# Patient Record
Sex: Male | Born: 1995 | Race: White | Hispanic: No | Marital: Married | Smoking: Former smoker
Health system: Southern US, Community
[De-identification: ages and names within clinical notes are randomized; demographics above are authoritative.]

## PROBLEM LIST (undated history)

## (undated) DIAGNOSIS — G43909 Migraine, unspecified, not intractable, without status migrainosus: Secondary | ICD-10-CM

## (undated) DIAGNOSIS — F419 Anxiety disorder, unspecified: Secondary | ICD-10-CM

## (undated) DIAGNOSIS — F32A Depression, unspecified: Secondary | ICD-10-CM

## (undated) DIAGNOSIS — I639 Cerebral infarction, unspecified: Secondary | ICD-10-CM

## (undated) DIAGNOSIS — F909 Attention-deficit hyperactivity disorder, unspecified type: Secondary | ICD-10-CM

## (undated) DIAGNOSIS — J45909 Unspecified asthma, uncomplicated: Secondary | ICD-10-CM

## (undated) DIAGNOSIS — Q213 Tetralogy of Fallot: Secondary | ICD-10-CM

## (undated) DIAGNOSIS — F329 Major depressive disorder, single episode, unspecified: Secondary | ICD-10-CM

## (undated) HISTORY — PX: TETRALOGY OF FALLOT REPAIR: SHX796

## (undated) HISTORY — DX: Anxiety disorder, unspecified: F41.9

## (undated) HISTORY — DX: Depression, unspecified: F32.A

## (undated) HISTORY — DX: Cerebral infarction, unspecified: I63.9

## (undated) HISTORY — DX: Migraine, unspecified, not intractable, without status migrainosus: G43.909

## (undated) HISTORY — DX: Major depressive disorder, single episode, unspecified: F32.9

## (undated) HISTORY — DX: Attention-deficit hyperactivity disorder, unspecified type: F90.9

## (undated) HISTORY — DX: Unspecified asthma, uncomplicated: J45.909

## (undated) HISTORY — DX: Tetralogy of Fallot: Q21.3

---
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
## 2015-03-20 ENCOUNTER — Ambulatory Visit (INDEPENDENT_AMBULATORY_CARE_PROVIDER_SITE_OTHER): Payer: BLUE CROSS/BLUE SHIELD | Admitting: Family Medicine

## 2015-03-20 ENCOUNTER — Encounter: Payer: Self-pay | Admitting: Family Medicine

## 2015-03-20 ENCOUNTER — Telehealth: Payer: Self-pay | Admitting: Family Medicine

## 2015-03-20 VITALS — BP 122/74 | HR 121 | Temp 98.4°F | Ht 69.0 in | Wt 192.8 lb

## 2015-03-20 DIAGNOSIS — J0100 Acute maxillary sinusitis, unspecified: Secondary | ICD-10-CM

## 2015-03-20 MED ORDER — AZITHROMYCIN 250 MG PO TABS: ORAL_TABLET | ORAL | Status: DC

## 2015-03-20 NOTE — Progress Notes (Signed)
Pre visit review using our clinic review tool, if applicable. No additional management support is needed unless otherwise documented below in the visit note. 

## 2015-03-20 NOTE — Progress Notes (Signed)
   Subjective:    Patient ID: Carlos GalloJonathan Poteat-Smith, male    DOB: [birth year], 19 y.o.   MRN: [id number]  HPI 19 yr old male here for 3 days of sinus pressure, HA, PND, ST, and a dry cough. No fever. He has taken Tylenol and is drinking fluids. This has not helped. His brother has recently been treated for a similar illness.    Review of Systems  Constitutional: Negative.   HENT: Positive for congestion, postnasal drip and sinus pressure.   Eyes: Negative.   Respiratory: Positive for cough.   Cardiovascular: Negative.        Objective:   Physical Exam  Constitutional: He appears well-developed and well-nourished. No distress.  HENT:  Right Ear: External ear normal.  Left Ear: External ear normal.  Nose: Nose normal.  Mouth/Throat: Oropharynx is clear and moist.  Eyes: Conjunctivae are normal.  Neck: No thyromegaly present.  Cardiovascular: Normal rate, regular rhythm, normal heart sounds and intact distal pulses.   Pulmonary/Chest: Effort normal and breath sounds normal. No respiratory distress. He has no wheezes. He has no rales.  Lymphadenopathy:    He has no cervical adenopathy.          Assessment & Plan:  Early sinusitis. Treat with a Zpack and Mucinex. He will return next month to get established with Dr. Durene CalHunter.

## 2015-03-20 NOTE — Telephone Encounter (Signed)
[facility] - Client TELEPHONE ADVICE RECORD [facility] DO[facility]: 1996/08/24 Initial Comment Caller states his son is having cold symptoms with a headache. Nurse Assessment Nurse: Lane HackerHarley, RN, Elvin SoWindy Date/Time (Eastern Time): 03/20/2015 8:54:55 AM Confirm and document reason for call. If symptomatic, describe symptoms. ---Caller states his son is having cold symptoms with a headache, rates 6-7/10. One of his nostrils is blocked, and pressure in the nose/cheek. Sore throat, rates pain now at 6/10 and hurts to swallow. - Family has been sick with these s/s also. S/S confirmed with son. Has the patient traveled out of the country within the last 30 days? ---No Does the patient require triage? ---Yes Related visit to physician within the last 2 weeks? ---No s/s started yesterday Does the PT have any chronic conditions? (i.e. diabetes, asthma, etc.) ---Yes List chronic conditions. ---Migraines Guidelines Guideline Title Affirmed Question Affirmed Notes Sinus Pain or Congestion Lots of coughing lots of coughing during the night only that woke him up twice; and HA kept him from sleeping til around 3 am. Final Disposition User See PCP When Office is Open (within 3 days) Lane HackerHarley, Charity fundraiserN, [place] Comments Father just wanted an "UC" type appt since his son has urgent s/s. He has not been seen at this practice yet. RN tried to schedule H&P visit sooner. Father states that they do not have the paper work with them, and just want an UC appt or they will go to UC. RN made acute appt. Call them back if any issues with appt. Thanks, Elvin SoWindy RN Appt made with Dr. Clent RidgesFry at 4:15 pm today.

## 2015-04-30 ENCOUNTER — Ambulatory Visit (INDEPENDENT_AMBULATORY_CARE_PROVIDER_SITE_OTHER): Payer: BLUE CROSS/BLUE SHIELD | Admitting: Family Medicine

## 2015-04-30 ENCOUNTER — Encounter: Payer: Self-pay | Admitting: Family Medicine

## 2015-04-30 VITALS — BP 92/64 | HR 56 | Temp 98.2°F | Ht 70.0 in | Wt 198.0 lb

## 2015-04-30 DIAGNOSIS — F909 Attention-deficit hyperactivity disorder, unspecified type: Secondary | ICD-10-CM | POA: Insufficient documentation

## 2015-04-30 DIAGNOSIS — L709 Acne, unspecified: Secondary | ICD-10-CM | POA: Diagnosis not present

## 2015-04-30 DIAGNOSIS — F900 Attention-deficit hyperactivity disorder, predominantly inattentive type: Secondary | ICD-10-CM | POA: Diagnosis not present

## 2015-04-30 DIAGNOSIS — Q213 Tetralogy of Fallot: Secondary | ICD-10-CM | POA: Diagnosis not present

## 2015-04-30 DIAGNOSIS — F329 Major depressive disorder, single episode, unspecified: Secondary | ICD-10-CM | POA: Insufficient documentation

## 2015-04-30 DIAGNOSIS — Z0000 Encounter for general adult medical examination without abnormal findings: Secondary | ICD-10-CM

## 2015-04-30 DIAGNOSIS — G43909 Migraine, unspecified, not intractable, without status migrainosus: Secondary | ICD-10-CM | POA: Insufficient documentation

## 2015-04-30 DIAGNOSIS — Z8659 Personal history of other mental and behavioral disorders: Secondary | ICD-10-CM | POA: Insufficient documentation

## 2015-04-30 DIAGNOSIS — J45909 Unspecified asthma, uncomplicated: Secondary | ICD-10-CM | POA: Insufficient documentation

## 2015-04-30 DIAGNOSIS — Z7251 High risk heterosexual behavior: Secondary | ICD-10-CM

## 2015-04-30 DIAGNOSIS — F32A Depression, unspecified: Secondary | ICD-10-CM | POA: Insufficient documentation

## 2015-04-30 LAB — POCT URINALYSIS DIPSTICK
BILIRUBIN UA: NEGATIVE
Blood, UA: NEGATIVE
Glucose, UA: NEGATIVE
Ketones, UA: NEGATIVE
Leukocytes, UA: NEGATIVE
Nitrite, UA: NEGATIVE
PROTEIN UA: NEGATIVE
SPEC GRAV UA: 1.025
Urobilinogen, UA: 0.2
pH, UA: 6.5

## 2015-04-30 LAB — CBC WITH DIFFERENTIAL/PLATELET
BASOS ABS: 0 10*3/uL (ref 0.0–0.1)
Basophils Relative: 0.4 % (ref 0.0–3.0)
Eosinophils Absolute: 0.1 10*3/uL (ref 0.0–0.7)
Eosinophils Relative: 1.8 % (ref 0.0–5.0)
HCT: 47.6 % (ref 36.0–49.0)
HEMOGLOBIN: 16.1 g/dL — AB (ref 12.0–16.0)
LYMPHS ABS: 3 10*3/uL (ref 0.7–4.0)
Lymphocytes Relative: 43.7 % (ref 24.0–48.0)
MCHC: 33.9 g/dL (ref 31.0–37.0)
MCV: 84.1 fl (ref 78.0–98.0)
MONO ABS: 0.7 10*3/uL (ref 0.1–1.0)
Monocytes Relative: 10 % (ref 3.0–12.0)
NEUTROS ABS: 3 10*3/uL (ref 1.4–7.7)
Neutrophils Relative %: 44.1 % (ref 43.0–71.0)
Platelets: 253 10*3/uL (ref 150.0–575.0)
RBC: 5.66 Mil/uL (ref 3.80–5.70)
RDW: 13.3 % (ref 11.4–15.5)
WBC: 6.9 10*3/uL (ref 4.5–13.5)

## 2015-04-30 LAB — COMPREHENSIVE METABOLIC PANEL
ALK PHOS: 58 U/L (ref 52–171)
ALT: 17 U/L (ref 0–53)
AST: 23 U/L (ref 0–37)
Albumin: 5.1 g/dL (ref 3.5–5.2)
BUN: 19 mg/dL (ref 6–23)
CHLORIDE: 102 meq/L (ref 96–112)
CO2: 28 mEq/L (ref 19–32)
Calcium: 9.8 mg/dL (ref 8.4–10.5)
Creatinine, Ser: 1.07 mg/dL (ref 0.40–1.50)
GFR: 94.36 mL/min (ref >60.00)
GLUCOSE: 100 mg/dL — AB (ref 70–99)
POTASSIUM: 4 meq/L (ref 3.5–5.1)
Sodium: 139 mEq/L (ref 135–145)
Total Bilirubin: 0.7 mg/dL (ref 0.2–1.2)
Total Protein: 8.1 g/dL (ref 6.0–8.3)

## 2015-04-30 LAB — LIPID PANEL
Cholesterol: 183 mg/dL (ref 0–200)
HDL: 54.6 mg/dL (ref >39.00)
LDL CALC: 116 mg/dL — AB (ref 0–99)
NonHDL: 128.4
Total CHOL/HDL Ratio: 3
Triglycerides: 63 mg/dL (ref 0.0–149.0)
VLDL: 12.6 mg/dL (ref 0.0–40.0)

## 2015-04-30 LAB — TSH: TSH: 2.25 u[IU]/mL (ref 0.40–5.00)

## 2015-04-30 NOTE — Patient Instructions (Addendum)
Goal exercise 150 minutes a week outside of work. Great you are getting activity at work as well.   Let's target by end of summer weight of 180.   Sign release of information at the front desk for records from Dr. Cephus Shellinguller especially any immunizations (though we will also check NCIR- state registry)  Schedule a lab visit and return for a urine STD test. This is done differently than urine you did today. Cannot have peed for an hour, need first part of your stream and only a little bit of urine- lab will instruct you on the day of. Always use protection  Recommend not smoking cigarettes at all.

## 2015-04-30 NOTE — Progress Notes (Signed)
Tana ConchStephen Hunter, MD Phone: [phone]  Subjective:  Patient presents today to establish care. [facility]. Chief complaint-noted.   Goes by Johnny   Peak weight 215 in college, HS weight 175. Has lost about 20 lbs since his peak.   Current some day smoker. Less than 20 in 12 months. Advised cessation Alcohol-once a week, advised cessation/safest use if using Recreational drugs- rare marijuana- advised cessation/safest use if using  6-7 hours sleep- could increase  Tdap 2018- getting records  Sees psychiatrist. Dr. Ivory Broadancey for ADHD meds  Migraines 5 total before freshman year, then had 1 about every 2 weeks, lasting day or 2. Stress as trigger. Had a lot of weight gain in college. Diet didn't change but physical activity did. Marland Kitchen.   anxiety/history when on 70mg  vyvanse- improved with decreased dose.  HPV vaccinated reportedly but denies STD screening- active with GF only Return for urine sample for STD portion  ROS- pertinent no unintentional weight loss, anxiety, depression, SI/HI, breathing issues, intractable headaches, chest pain, shortness of breath, nausea, vomiting.   The following were reviewed and entered/updated in epic: Past Medical History  Diagnosis Date  . Migraines   . Asthma     exercise induced  . Depression     anxiety/history when on 70mg  vyvanse- improved with decreased dose.   . ADHD (attention deficit hyperactivity disorder)     vyvanse 60mg   . Tetralogy of Fallot     s/p repair. [facility] saw cardiology until about age 44-told return 7 years.    Patient Active Problem List   Diagnosis Date Noted  . ADHD (attention deficit hyperactivity disorder)     Priority: High  . Tetralogy of Fallot     Priority: High  . Asthma     Priority: Medium  . Migraines     Priority: Medium  . Depression     Priority: Medium   Past Surgical History  Procedure Laterality Date  . Ganglion cyst excision  2014  . Tetralogy of fallot  repair      Family History  Problem Relation Age of Onset  . Other Mother     adopted  . Alcohol abuse      biological parents per adopted father    Medications- reviewed and updated Current Outpatient Prescriptions  Medication Sig Dispense Refill  . lisdexamfetamine (VYVANSE) 60 MG capsule Take 60 mg by mouth every morning.     No current facility-administered medications for this visit.    Allergies-reviewed and updated No Known Allergies  History   Social History  . Marital Status: Single    Spouse Name: N/A  . Number of Children: N/A  . Years of Education: N/A   Social History Main Topics  . Smoking status: Current Some Day Smoker  . Smokeless tobacco: Not on file  . Alcohol Use: 0.6 oz/week    1 Standard drinks or equivalent per week  . Drug Use: Yes    Special: Marijuana  . Sexual Activity:    Partners: Female   Other Topics Concern  . None   Social History Narrative   Family: Single. Adopted Son of [place] AnisKelly Smith of replacements limited (also patient of Dr. Durene CalHunter). 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 Oropharynx normal. TM normal. Eyes: sclera and lids normal, PERRLA Neck: no thyromegaly, no cervical lymphadenopathy CV: RRR, 2/6 diastolic murmur LUSB, no rubs or gallops Lungs: CTAB no crackles, wheeze, rhonchi Abdomen: soft/nontender/nondistended/normal  bowel sounds. No rebound or guarding.  GU: uncircumcised, no lesions or warts Ext: no edema Skin: warm, dry, no rash Neuro: 5/5 strength in upper and lower extremities, normal gait, normal reflexes   Assessment/Plan:  19 y.o. male presenting for annual physical.  Health Maintenance counseling: 1. Anticipatory guidance: Patient counseled regarding regular dental exams, wearing seatbelts, sunscreen.  2. Risk factor reduction:  Advised patient of need for regular exercise and diet rich and fruits and vegetables to reduce risk of heart attack and stroke.  3. Immunizations/screenings/ancillary studies Health Maintenance Due  Topic Date Due  . HIV Screening - add on if able 01/22/2011  . TETANUS/TDAP - getting records 01/22/2015  4. Advise testicular self exams- already doing, no concerns 5. Opts in STD testint  NCIR checked- states no immunizations found, will await records  ADHD controlled under psych care Await tetralogy of fallot repair records No recent issues with asthma No depression Migraines reasonably controlled.   1 year CPE. See AVS goals.   Orders Placed This Encounter  Procedures  . CBC with Differential  . CMP  . TSH  . Lipid Panel  . RPR    [facility]  . HIV antibody    [facility]  . POC Urinalysis Dipstick

## 2015-05-01 DIAGNOSIS — L709 Acne, unspecified: Secondary | ICD-10-CM | POA: Insufficient documentation

## 2015-05-01 LAB — HIV ANTIBODY (ROUTINE TESTING W REFLEX): HIV: NONREACTIVE

## 2015-05-01 LAB — RPR

## 2015-05-01 NOTE — Progress Notes (Signed)
Copied from care everywhere   Carlos Jacobs, Carlos L, MD - 03/13/2013 1:23 PM EDT Assessment/Plan:   My impression is that Carlos Jacobs is a 19 y.o. 1 m.o. male with a history of tetralogy of Fallot repair who is stable from a cardiac standpoint. Carlos Jacobs's echocardiogram today looks completely fine. He did get some mild pulmonary insufficiency and normal Doppler flows across all other valves. There is normal biventricular size and function seen. I cannot give a cardiac etiology as to his persistent cough at this time. Perhaps he is having some allergies, or maybe this is bronchospasm. He does seem to resume exercise perhaps formal exercise testing may be helpful. His EKG is unchanged from the past and his chest x-ray looks very reassuring.  SBE prophylaxis is not indicated..   Activity: Carlos Jacobs should have no restrictions from a cardiac standpoint. He should be fully allowed to participate in all sports.   Follow up: I have not made a specific follow up to see Carlos Jacobs back in my pediatric cardiology office at this time, but I would be happy to do so in future should the need arise.   I discussed all findings with the patient and father and answered all questions.   Thank you for allowing me to participate in Carlos Jacobs's care. Please do not hesitate to contact me if there are any further questions.  History:  I had the pleasure of seeing Carlos Jacobs in [place] today at the request of Dr. Larene Beachhristopher Culler for evaluation of a persistent cough. Carlos Jacobs is a 19 y.o. 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 His cough seems to been exacerbated for the past 2-3 months after he had a sinus infection. He says the cough occurs when he is laying down to sleep at night or when he's exercising. He denies any chest pain, syncope, cyanosis, or easy  fatigability. He was recently started on an antacid, because he did have some issues related to gagging or "food coming up ". He has noted no change in the cough with his antacid.   Medications: Current outpatient prescriptions:lisdexamfetamine (VYVANSE) 60 MG capsule, Take 60 mg by mouth. Frequency:QD Dosage:60 MG Instructions: Note:Dose: 60MG , Disp: , Rfl:   No Known Allergies  Immunizations: Up to date  Diet: Regular for age   Physcial Exam:  Filed Vitals:  03/13/13 1142  BP: 116/65  Pulse: 83  Resp: 20  Height: 174.5 cm (5' 8.7")  Weight: 66.6 kg (146 lb 13.2 oz)   Body mass index is 21.87 kg/(m^2). 56%ile (Z=0.15) based on CDC 2-20 Years weight-for-age data. 45%ile (Z=-0.13) based on CDC 2-20 Years stature-for-age data.  General: Well appearing in no acute distress Head: Normocephalic, atraumatic  Eyes: Normal set, anicteric Ears: Normal set Oropharynx: Pink, moist mucosa Neck: Supple, no thyromegaly Lymph: No cervical lymphadenopathy Pulmonary: Normal respiratory effort, clear to ausculation bilaterally without crackles or wheezes Cardiovascular: Normal precordial impulse, regular rate and rhythm. There was a normal S1 and physiologic split S2. No systolic or diastolic murmur noted. No carotid bruits. Pulses 2+ upper and lower extremities and symmetric throughout. Abdomen: Soft, non tender, non distended, no hepatosplenomegaly, positive bowel sounds Extremities: Warm, moves all extremities spontaneously, no obvious deformities Skin: No cyanosis, clubbing, or edema Neuro: Grossly intact  Lab Data:   A 12 lead ECG was performed and revealed normal sinus rhythm with a normal  PR, QRS, and corrected QT interval. There is a right axis seen. A pediatric echocardiogram was performed and revealed normal biventricular size and function, there was mild pulmonary insufficiency seen and normal Doppler flows across all other valves within the heart.

## 2015-05-02 ENCOUNTER — Other Ambulatory Visit (HOSPITAL_COMMUNITY)
Admission: RE | Admit: 2015-05-02 | Discharge: 2015-05-02 | Disposition: A | Discharge status: Home / Self Care | Payer: BLUE CROSS/BLUE SHIELD | Source: Ambulatory Visit | Attending: Family Medicine | Admitting: Family Medicine

## 2015-05-02 ENCOUNTER — Other Ambulatory Visit: Payer: BLUE CROSS/BLUE SHIELD

## 2015-05-02 DIAGNOSIS — Z113 Encounter for screening for infections with a predominantly sexual mode of transmission: Secondary | ICD-10-CM | POA: Insufficient documentation

## 2015-05-02 DIAGNOSIS — Z7251 High risk heterosexual behavior: Secondary | ICD-10-CM

## 2015-05-02 DIAGNOSIS — Z0000 Encounter for general adult medical examination without abnormal findings: Secondary | ICD-10-CM

## 2015-05-03 ENCOUNTER — Other Ambulatory Visit: Payer: BLUE CROSS/BLUE SHIELD

## 2015-05-03 LAB — URINE CYTOLOGY ANCILLARY ONLY
Chlamydia: NEGATIVE
Neisseria Gonorrhea: NEGATIVE
TRICH (WINDOWPATH): NEGATIVE

## 2015-05-09 ENCOUNTER — Ambulatory Visit (INDEPENDENT_AMBULATORY_CARE_PROVIDER_SITE_OTHER): Payer: BLUE CROSS/BLUE SHIELD | Admitting: Adult Health

## 2015-05-09 ENCOUNTER — Encounter: Payer: Self-pay | Admitting: Adult Health

## 2015-05-09 VITALS — BP 110/80 | Temp 98.2°F | Ht 70.0 in | Wt 200.2 lb

## 2015-05-09 DIAGNOSIS — R519 Headache, unspecified: Secondary | ICD-10-CM

## 2015-05-09 DIAGNOSIS — R51 Headache: Secondary | ICD-10-CM

## 2015-05-09 NOTE — Progress Notes (Signed)
Subjective:    Patient ID: Carlos Jacobs, male    DOB: 06-08-1996, 19 y.o.   MRN: [id number]  HPI  19 year old healthy male who presents to the office today to be evaluated for sinus infection. He woke up this morning with temporal headache and temporal sinus pressure. The headache has diminished in quality but continues to have sinus pressure. Denies fever, chills and fatigue  Denies taking any OTC medications. Father has a sinus infection.   Review of Systems  Constitutional: Negative.   HENT: Positive for congestion and sinus pressure. Negative for ear discharge, ear pain, nosebleeds, postnasal drip, rhinorrhea and sore throat.   Eyes: Negative.   Respiratory: Negative for shortness of breath.   Neurological: Negative.   All other systems reviewed and are negative.  Past Medical History  Diagnosis Date  . Migraines   . Asthma     exercise induced  . Depression     anxiety/history when on 70mg  vyvanse- improved with decreased dose.   . ADHD (attention deficit hyperactivity disorder)     vyvanse 60mg   . Tetralogy of Fallot     s/p repair. [facility] saw cardiology until about age 19-told return 7 years.     History   Social History  . Marital Status: Single    Spouse Name: N/A  . Number of Children: N/A  . Years of Education: N/A   Occupational History  . Not on file.   Social History Main Topics  . Smoking status: Current Some Day Smoker  . Smokeless tobacco: Not on file  . Alcohol Use: 0.6 oz/week    1 Standard drinks or equivalent per week  . Drug Use: Yes    Special: Marijuana  . Sexual Activity:    Partners: Female   Other Topics Concern  . Not on file   Social History Narrative   Family: Single. Adopted Son of [place] Anis of replacements limited (also patient of Dr. Durene Cal). 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 Ganglion cyst excision  2014  . Tetralogy of fallot repair      Family History  Problem Relation Age of Onset  . Other Mother     adopted  . Alcohol abuse      biological parents per adopted father    No Known Allergies  Current Outpatient Prescriptions on File Prior to Visit  Medication Sig Dispense Refill  . lisdexamfetamine (VYVANSE) 60 MG capsule Take 60 mg by mouth every morning.     No current facility-administered medications on file prior to visit.    BP 110/80 mmHg  Temp(Src) 98.2 F (36.8 C) (Oral)  Ht 5\' 10"  (1.778 m)  Wt 200 lb 3.2 oz (90.81 kg)  BMI 28.73 kg/m2        Objective:   Physical Exam  Constitutional: He is oriented to person, place, and time. He appears well-developed and well-nourished. No distress.  HENT:  Head: Normocephalic and atraumatic.  Right Ear: External ear normal.  Left Ear: External ear normal.  Nose:  Nose normal.  Mouth/Throat: No oropharyngeal exudate.  TM's visualized  Eyes: Conjunctivae and EOM are normal. Pupils are equal, round, and reactive to light. Right eye exhibits no discharge. Left eye exhibits no discharge.  Neck: Normal range of motion. Neck supple.  Cardiovascular: Normal rate, regular rhythm, normal heart sounds and intact distal pulses.  Exam reveals no gallop and no friction rub.   No murmur heard. Pulmonary/Chest: Effort normal and breath sounds normal. No respiratory distress. He has no wheezes. He has no rales. He exhibits no tenderness.  Lymphadenopathy:    He has cervical adenopathy.  Neurological: He is alert and oriented to person, place, and time.  Skin: Skin is warm and dry. No rash noted. He is not diaphoretic. No erythema. No pallor.    Psychiatric: He has a normal mood and affect. His behavior is normal. Judgment and thought content normal.  Nursing note and vitals reviewed.      Assessment & Plan:  1. Nonintractable episodic headache, unspecified headache type - may be sinus infection or allergies, it is too soon to tell.  - Flonase and zyrtec for sinus pressure - Tylenol or motrin for headache.  - Follow up if symptoms do not improve in the next 2-3 days.

## 2015-05-09 NOTE — Patient Instructions (Signed)
As we discussed, it is hard to tell whether this is a sinus infection or allergies.   Use Ibuprofen or tylenol for the headache  Flonase and zyrtec will help with the sinus pressure.   If you still have the symptoms 7-10 days from now, please let me know.

## 2015-06-04 ENCOUNTER — Telehealth: Payer: Self-pay | Admitting: Family Medicine

## 2015-06-04 DIAGNOSIS — L709 Acne, unspecified: Secondary | ICD-10-CM

## 2015-06-04 NOTE — Telephone Encounter (Signed)
Pt states his pediatrician prescribed  Differin for acne. Now pt is out and wants to know i dr hunter will prescribe for him.  [facility]  Pt aware dr hunter out today. cotact number for this call is his dad's b/c pt is working.

## 2015-06-06 MED ORDER — ADAPALENE 0.1 % EX GEL: Freq: Every day | CUTANEOUS | Status: DC

## 2015-06-06 NOTE — Telephone Encounter (Signed)
[facility]. Please make patient aware.   It also appears FDA recently (July 2016) made this formulation available over the counter. May be cheaper with rx but may want to price check as well if they have OTC formulation.

## 2015-06-06 NOTE — Telephone Encounter (Signed)
Pt.notified

## 2015-06-14 ENCOUNTER — Other Ambulatory Visit: Payer: Self-pay | Admitting: Family Medicine

## 2015-06-14 DIAGNOSIS — H9190 Unspecified hearing loss, unspecified ear: Secondary | ICD-10-CM

## 2016-05-27 ENCOUNTER — Other Ambulatory Visit (INDEPENDENT_AMBULATORY_CARE_PROVIDER_SITE_OTHER): Payer: No Typology Code available for payment source

## 2016-05-27 DIAGNOSIS — Z0000 Encounter for general adult medical examination without abnormal findings: Secondary | ICD-10-CM

## 2016-05-27 LAB — HEPATIC FUNCTION PANEL
ALBUMIN: 4.9 g/dL (ref 3.5–5.2)
ALK PHOS: 50 U/L (ref 39–117)
ALT: 20 U/L (ref 0–53)
AST: 26 U/L (ref 0–37)
BILIRUBIN DIRECT: 0.2 mg/dL (ref 0.0–0.3)
TOTAL PROTEIN: 7.9 g/dL (ref 6.0–8.3)
Total Bilirubin: 1 mg/dL (ref 0.2–1.2)

## 2016-05-27 LAB — CBC WITH DIFFERENTIAL/PLATELET
BASOS ABS: 0 10*3/uL (ref 0.0–0.1)
BASOS PCT: 0.4 % (ref 0.0–3.0)
EOS ABS: 0.1 10*3/uL (ref 0.0–0.7)
EOS PCT: 0.8 % (ref 0.0–5.0)
HEMATOCRIT: 45.6 % (ref 39.0–52.0)
Hemoglobin: 15.5 g/dL (ref 13.0–17.0)
LYMPHS PCT: 30.4 % (ref 12.0–46.0)
Lymphs Abs: 2.4 10*3/uL (ref 0.7–4.0)
MCHC: 34.1 g/dL (ref 30.0–36.0)
MCV: 83.7 fl (ref 78.0–100.0)
Monocytes Absolute: 0.9 10*3/uL (ref 0.1–1.0)
Monocytes Relative: 11.4 % (ref 3.0–12.0)
NEUTROS ABS: 4.5 10*3/uL (ref 1.4–7.7)
NEUTROS PCT: 57 % (ref 43.0–77.0)
PLATELETS: 246 10*3/uL (ref 150.0–400.0)
RBC: 5.44 Mil/uL (ref 4.22–5.81)
RDW: 13.2 % (ref 11.5–14.6)
WBC: 8 10*3/uL (ref 4.5–10.5)

## 2016-05-27 LAB — POC URINALSYSI DIPSTICK (AUTOMATED)
GLUCOSE UA: NEGATIVE
Ketones, UA: NEGATIVE
Leukocytes, UA: NEGATIVE
NITRITE UA: NEGATIVE
PH UA: 5.5
RBC UA: NEGATIVE
Urobilinogen, UA: 0.2

## 2016-05-27 LAB — LIPID PANEL
CHOL/HDL RATIO: 3
Cholesterol: 161 mg/dL (ref 0–200)
HDL: 53.7 mg/dL (ref >39.00)
LDL Cholesterol: 97 mg/dL (ref 0–99)
NONHDL: 107.12
Triglycerides: 50 mg/dL (ref 0.0–149.0)
VLDL: 10 mg/dL (ref 0.0–40.0)

## 2016-05-27 LAB — TSH: TSH: 3.29 u[IU]/mL (ref 0.35–5.50)

## 2016-05-27 LAB — BASIC METABOLIC PANEL
BUN: 16 mg/dL (ref 6–23)
CHLORIDE: 103 meq/L (ref 96–112)
CO2: 27 meq/L (ref 19–32)
Calcium: 9.7 mg/dL (ref 8.4–10.5)
Creatinine, Ser: 1.12 mg/dL (ref 0.40–1.50)
GFR: 88.53 mL/min (ref >60.00)
Glucose, Bld: 90 mg/dL (ref 70–99)
POTASSIUM: 3.7 meq/L (ref 3.5–5.1)
Sodium: 139 mEq/L (ref 135–145)

## 2016-06-02 ENCOUNTER — Encounter: Payer: BLUE CROSS/BLUE SHIELD | Admitting: Family Medicine

## 2016-09-18 ENCOUNTER — Encounter: Payer: Self-pay | Admitting: Family Medicine

## 2016-09-18 ENCOUNTER — Ambulatory Visit (INDEPENDENT_AMBULATORY_CARE_PROVIDER_SITE_OTHER): Payer: No Typology Code available for payment source | Admitting: Family Medicine

## 2016-09-18 VITALS — BP 92/66 | HR 72 | Temp 97.5°F | Ht 70.0 in | Wt 210.0 lb

## 2016-09-18 DIAGNOSIS — Z0000 Encounter for general adult medical examination without abnormal findings: Secondary | ICD-10-CM | POA: Diagnosis not present

## 2016-09-18 NOTE — Progress Notes (Signed)
Phone: [phone]  Subjective:  Patient presents today for their annual physical. Chief complaint-noted.   See problem oriented charting- ROS- full  review of systems was completed and negative except for: weight gain- but has not been exercising  The following were reviewed and entered/updated in epic: Past Medical History:  Diagnosis Date  . ADHD (attention deficit hyperactivity disorder)    vyvanse '60mg'$   . Asthma    exercise induced  . Depression    anxiety/history when on '70mg'$  vyvanse- improved with decreased dose.   . Migraines   . Tetralogy of Fallot    s/p repair. [facility] saw cardiology until about age 39-told return 7 years.    Patient Active Problem List   Diagnosis Date Noted  . ADHD (attention deficit hyperactivity disorder)     Priority: High  . Tetralogy of Fallot     Priority: High  . Asthma     Priority: Medium  . Migraines     Priority: Medium  . Depression     Priority: Medium  . Acne 05/01/2015   Past Surgical History:  Procedure Laterality Date  . GANGLION CYST EXCISION  2014  . TETRALOGY OF FALLOT REPAIR      Family History  Problem Relation Age of Onset  . Other Mother     adopted  . Alcohol abuse      biological parents per adopted father    Medications- reviewed and updated Current Outpatient Prescriptions  Medication Sig Dispense Refill  . adapalene (DIFFERIN) 0.1 % gel Apply topically at bedtime. 45 g 5  . lisdexamfetamine (VYVANSE) 60 MG capsule Take 60 mg by mouth every morning.     No current facility-administered medications for this visit.     Allergies-reviewed and updated No Known Allergies  Social History   Social History  . Marital status: Single    Spouse name: N/A  . Number of children: N/A  . Years of education: N/A   Social History Main Topics  . Smoking status: Former Smoker    Types: Cigarettes  . Smokeless tobacco: None  . Alcohol use 0.6 oz/week    1 Standard drinks or equivalent per week  .  Drug use:     Types: Marijuana  . Sexual activity: Yes    Partners: Female   Other Topics Concern  . None   Social History Narrative   Family: Single- new GF 09/2016. Adopted Son of [place] of replacements limited (also patient of Dr. Yong Channel). 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 Oropharynx normal Neck: no thyromegaly CV: RRR no murmurs rubs or gallops Lungs: CTAB no crackles, wheeze, rhonchi Abdomen: soft/nontender/nondistended/normal bowel sounds. No rebound or guarding. Overweight- weight up  some from last year Ext: no edema Skin: warm, dry Neuro: grossly normal, moves all extremities, PERRLA   Assessment/Plan:  20 y.o. male presenting for annual physical.  Health Maintenance counseling: 1. Anticipatory guidance: Patient counseled regarding regular dental exams, eye exams (thinking about going for a check in- mild reading issues at times), wearing seatbelts.  2. Risk factor reduction:  Advised patient of need for regular exercise and diet rich and fruits and vegetables to reduce risk of heart attack and stroke. Up almost 20 lbs over last 18 months- discussed weight loss and starting exercise 3. Immunizations/screenings/ancillary studies Immunization History  Administered Date(s) Administered  . DTaP  03/24/1996, 06/01/1996, 07/20/1996, 06/25/1997, 09/07/2000  . HPV Quadrivalent 05/16/2012, 07/20/2012, 11/29/2012  . Hepatitis A 05/04/2011, 05/16/2012  . Hepatitis B 1996-02-19, 02/22/1996, 12/11/1996  . HiB (PRP-OMP) 03/24/1996, 06/01/1996, 07/20/1996, 05/15/1997  . IPV 03/24/1996, 06/01/1996, 06/25/1997, 09/07/2000  . Influenza Nasal 07/31/2009, 08/12/2011, 07/20/2012  . Influenza-Unspecified 08/27/2007, 08/08/2008, 09/21/2008, 09/04/2016  . MMR 06/15/1997, 11/03/2002  . Meningococcal Conjugate 05/04/2011  . Meningococcal Polysaccharide 05/25/2013  . Pneumococcal Polysaccharide-23 10/13/2014  . Tdap 01/24/2007  . Varicella 01/24/1997, 05/25/2013  4. Prostate cancer screening- unknown family history, start age 39  5. Colon cancer screening - unknown family history, start age 21 6. Skin cancer prevention- advised regular sunscreen use 7. Testicular cancer screening- advised regular self exams 8. STD testing- declines- did before getting into monogamous relationship  Status of chronic or acute concerns  History of tetralogy of fallot- repair 19 months. Told by [facility] ped cards no regular follow up  ADHD through Dr. Osborne Oman of psychiatry. dealth with anxiety on higher dose vyvanse- improve dback on the 73m dose.   Migraines- still about once a month no meds previously- now improving and very infrequent  Asthma- exercise induced but asthma symptoms only.   Acne- has used differin in the past  Has quit smoking- congratulated and encouraged to remain off  New GF- sexually active- GF with IUD. Both tested before active. Declines today   Return in about 1 year (around 09/18/2017) for physical. We discussed 2 years is reasonable as well as long as working on weight loss  Return precautions advised.   SGarret Reddish MD

## 2016-09-18 NOTE — Patient Instructions (Signed)
No changes today except work on trimming those extra 20 lbs back off  Somehow labs look better despite weight gain

## 2016-09-18 NOTE — Progress Notes (Signed)
Pre visit review using our clinic review tool, if applicable. No additional management support is needed unless otherwise documented below in the visit note. 

## 2016-10-14 ENCOUNTER — Telehealth: Payer: Self-pay | Admitting: Family Medicine

## 2016-10-14 NOTE — Telephone Encounter (Signed)
Pt called in and spoke with LaWana who asked me to take over call as she stated the pt was upset do to his 11 minute wait time to get through to us.  The call was parked and when I picked up the call the pt was very upset and aggressively addressed his frustrations.  He stated that he was angry because our office doesn't have any appts available this evening, our local offices have no available appts and that we can only offer [facility] which [facility] [place] minutes from his home.  He went on further to state that he called [facility] urgent care and that they couldn't see him either.  I apologized repeatedly for his negative experience in trying to get through and advised that we are aware that the phones have been acting up today and were working hard to correct the issue. He went on to say that the phones have been an issue for years and that we must not care that he and his insurance pay us so much money.  I apologized again and asked him if I could make him an appt for the following morning and he refused.

## 2017-01-21 ENCOUNTER — Emergency Department (HOSPITAL_COMMUNITY)
Admission: EM | Admit: 2017-01-21 | Discharge: 2017-01-21 | Disposition: A | Discharge status: Home / Self Care | Payer: No Typology Code available for payment source | Attending: Emergency Medicine | Admitting: Emergency Medicine

## 2017-01-21 ENCOUNTER — Emergency Department (HOSPITAL_COMMUNITY): Payer: No Typology Code available for payment source

## 2017-01-21 ENCOUNTER — Encounter (HOSPITAL_COMMUNITY): Payer: Self-pay | Admitting: Nurse Practitioner

## 2017-01-21 DIAGNOSIS — F17210 Nicotine dependence, cigarettes, uncomplicated: Secondary | ICD-10-CM | POA: Diagnosis not present

## 2017-01-21 DIAGNOSIS — F909 Attention-deficit hyperactivity disorder, unspecified type: Secondary | ICD-10-CM | POA: Insufficient documentation

## 2017-01-21 DIAGNOSIS — J45909 Unspecified asthma, uncomplicated: Secondary | ICD-10-CM | POA: Diagnosis not present

## 2017-01-21 DIAGNOSIS — R0789 Other chest pain: Secondary | ICD-10-CM | POA: Diagnosis not present

## 2017-01-21 DIAGNOSIS — R05 Cough: Secondary | ICD-10-CM | POA: Diagnosis present

## 2017-01-21 MED ORDER — NAPROXEN 500 MG PO TABS: 500.0000 mg | ORAL_TABLET | Freq: Two times a day (BID) | ORAL | 0 refills | Status: DC

## 2017-01-21 MED ORDER — KETOROLAC TROMETHAMINE 60 MG/2ML IM SOLN
60.0000 mg | Freq: Once | INTRAMUSCULAR | Status: AC
  Administered 2017-01-21: 60 mg via INTRAMUSCULAR
  Filled 2017-01-21: qty 2

## 2017-01-21 NOTE — ED Provider Notes (Signed)
[facility]-EMERGENCY DEPT Provider Note   CSN: [id number] Arrival date & time: 01/21/17  4098     History   Chief Complaint Chief Complaint  Patient presents with  . Cough    HPI Carlos Jacobs is a 21 y.o. male.  The history is provided by the patient and medical records.  Cough  Associated symptoms include chest pain.   21 y.o. M with hx of ADHD, asthma, depression, migraine headaches, hx of TOF repaired at birth without residual issues, presenting to the ED for chest pain.  Patient reports he had a cough for about 2 days earlier in the week, was dry and non-productive.  States he did not feel like it was even a hard cough.  Reports yesterday around 630 PM he started having central chest pain localized to sternum, mostly lower portion.  States no radiation of pain.  No associated dizziness, weakness, SOB, diaphoresis, nausea, vomiting, lightheadedness.  States pain is worse with palpation and movement.  States he has not had any cardiac issues since his TOF repair, was actually released from cardiology several years ago.  He is a smoker.  Denies recent drug use.  Has not tried any medications for his symptoms thus far.  Past Medical History:  Diagnosis Date  . ADHD (attention deficit hyperactivity disorder)    vyvanse   . Asthma    exercise induced  . Depression    anxiety/history when on  vyvanse- improved with decreased dose.   . Migraines   . Tetralogy of Fallot    s/p repair. [facility] saw cardiology until about age 21-told return 7 years.     Patient Active Problem List   Diagnosis Date Noted  . Acne 05/01/2015  . Asthma   . Migraines   . Depression   . ADHD (attention deficit hyperactivity disorder)   . Tetralogy of Fallot     Past Surgical History:  Procedure Laterality Date  . GANGLION CYST EXCISION  2014  . TETRALOGY OF FALLOT REPAIR         Home Medications    Prior to Admission medications   Medication Sig Start Date End Date Taking?  Authorizing Provider  adapalene (DIFFERIN) 0.1 % gel Apply topically at bedtime. 06/06/15   Shelva Majestic, MD  lisdexamfetamine (VYVANSE) 60 MG capsule Take 60 mg by mouth every morning.    Historical Provider, MD    Family History Family History  Problem Relation Age of Onset  . Other Mother     adopted  . Alcohol abuse      biological parents per adopted father    Social History Social History  Substance Use Topics  . Smoking status: Current Every Day Smoker    Packs/day: 0.50    Types: Cigarettes  . Smokeless tobacco: Never Used  . Alcohol use 0.6 oz/week    1 Standard drinks or equivalent per week     Comment: on occassion. Last beer at midnight tonight      Allergies   Patient has no known allergies.   Review of Systems Review of Systems  Respiratory: Positive for cough.   Cardiovascular: Positive for chest pain.  All other systems reviewed and are negative.    Physical Exam Updated Vital Signs BP 116/72   Pulse 70   Temp 98.1 F (36.7 C) (Oral)   Resp (!) 21   Ht  (1.803 m)   Wt 93 kg   SpO2 99%   BMI 28.59 kg/m   Physical Exam  Constitutional: He is oriented to person, place, and time. He appears well-developed and well-nourished.  HENT:  Head: Normocephalic and atraumatic.  Mouth/Throat: Oropharynx is clear and moist.  Eyes: Conjunctivae and EOM are normal. Pupils are equal, round, and reactive to light.  Neck: Normal range of motion.  Cardiovascular: Normal rate, regular rhythm and normal heart sounds.   Pulmonary/Chest: Effort normal and breath sounds normal. He has no wheezes. He has no rhonchi.  Tenderness of the mid chest wall along the sternum, there is no appreciable deformity, lungs are clear without wheezes or rhonchi, no distress, speaking in full sentences without difficulty  Abdominal: Soft. Bowel sounds are normal.  Musculoskeletal: Normal range of motion.  Neurological: He is alert and oriented to person, place, and time.    Skin: Skin is warm and dry.  Psychiatric: He has a normal mood and affect.  Nursing note and vitals reviewed.    ED Treatments / Results  Labs (all labs ordered are listed, but only abnormal results are displayed) Labs Reviewed - No data to display  EKG  EKG Interpretation  Date/Time:  Thursday January 21 2017 03:27:20 EDT Ventricular Rate:  84 PR Interval:    QRS Duration: 113 QT Interval:  398 QTC Calculation: 471 R Axis:   104 Text Interpretation:  Sinus rhythm Borderline intraventricular conduction delay ST elev, probable normal early repol pattern Borderline prolonged QT interval Baseline wander in lead(s) II aVF V3 V4 V5 No previous ECGs available Confirmed by Read Drivers  MD, Jonny Ruiz ([id number]) on 01/21/2017 3:48:22 AM       Radiology Dg Chest 2 View  Result Date: 01/21/2017 CLINICAL DATA:  Acute onset of midsternal chest pain. Initial encounter. EXAM: CHEST  2 VIEW COMPARISON:  None. FINDINGS: The lungs are well-aerated and clear. There is no evidence of focal opacification, pleural effusion or pneumothorax. The heart is normal in size; the mediastinal contour is within normal limits. The patient is status post median sternotomy. No acute osseous abnormalities are seen. IMPRESSION: No acute cardiopulmonary process seen. Electronically Signed   By: Roanna Raider M.D.   On: 01/21/2017 06:38    Procedures Procedures (including critical care time)  Medications Ordered in ED Medications  ketorolac (TORADOL) injection 60 mg (not administered)     Initial Impression / Assessment and Plan / ED Course  I have reviewed the triage vital signs and the nursing notes.  Pertinent labs & imaging results that were available during my care of the patient were reviewed by me and considered in my medical decision making (see chart for details).  21 year old male here with chest pain. Began last night around 6:30. Pain is worsened with palpation and movement. He is afebrile and nontoxic. Vital  signs are stable. EKG nonischemic. Pain is reproducible with palpation of the sternum, no noted deformities. Lungs are clear without wheezes or rhonchi. Chest x-ray was obtained which is negative.  Pain resolved here with Toradol, patient has been resting comfortably. At this time, feel this is likely chest wall pain. I do not suspect acute ACS, PE, dissection, or other acute cardiac event. Feel patient is stable for discharge. We'll continue anti-inflammatories. Will have him follow-up with his primary care doctor.  Discussed plan with patient, he acknowledged understanding and agreed with plan of care.  Return precautions given for new or worsening symptoms.  Final Clinical Impressions(s) / ED Diagnoses   Final diagnoses:  Chest wall pain    New Prescriptions New Prescriptions   NAPROXEN (NAPROSYN) 500 MG TABLET  Take 1 tablet (500 mg total) by mouth 2 (two) times daily with a meal.     Garlon Hatchet, PA-C 01/21/17 1610    Paula Libra, MD 01/21/17 2238

## 2017-01-21 NOTE — ED Notes (Signed)
Pt in XRAY 

## 2017-01-21 NOTE — ED Triage Notes (Signed)
Patient states he has been having upper respiratory symptoms over the past 3-4 days with cough, congestion. Denies fever. Has been taking sudafed, mucinex, and OTC cough medications. Last night started having some mid chest pain with coughing and movement. Denies pain radiating. Pain is reproducible with touch. Denies any shortness of breath, dizziness, light headedness, or pain in other areas.

## 2017-01-21 NOTE — ED Notes (Signed)
Pt verbalized pain 10 out 10 on movement but pain 2 out of 10 at rest. Pain meds given pain now a 7 out of 10 on movement, and 2 out 10 not moving still not moving.

## 2017-01-21 NOTE — Discharge Instructions (Signed)
Take the prescribed medication as directed. °Follow-up with your primary care doctor for any ongoing issues. °Return to the ED for new or worsening symptoms. °

## 2017-04-30 ENCOUNTER — Ambulatory Visit (INDEPENDENT_AMBULATORY_CARE_PROVIDER_SITE_OTHER): Payer: No Typology Code available for payment source | Admitting: Family Medicine

## 2017-04-30 ENCOUNTER — Encounter: Payer: Self-pay | Admitting: Family Medicine

## 2017-04-30 VITALS — BP 120/72 | HR 79 | Temp 98.7°F | Ht 70.25 in | Wt 220.2 lb

## 2017-04-30 DIAGNOSIS — Z23 Encounter for immunization: Secondary | ICD-10-CM | POA: Diagnosis not present

## 2017-04-30 DIAGNOSIS — E785 Hyperlipidemia, unspecified: Secondary | ICD-10-CM | POA: Diagnosis not present

## 2017-04-30 DIAGNOSIS — Z0000 Encounter for general adult medical examination without abnormal findings: Secondary | ICD-10-CM

## 2017-04-30 LAB — CBC
HCT: 45.9 % (ref 39.0–52.0)
HEMOGLOBIN: 15.2 g/dL (ref 13.0–17.0)
MCHC: 33 g/dL (ref 30.0–36.0)
MCV: 85.5 fl (ref 78.0–100.0)
Platelets: 241 10*3/uL (ref 150.0–400.0)
RBC: 5.37 Mil/uL (ref 4.22–5.81)
RDW: 13.2 % (ref 11.5–15.5)
WBC: 5.4 10*3/uL (ref 4.0–10.5)

## 2017-04-30 LAB — LIPID PANEL
CHOLESTEROL: 162 mg/dL (ref 0–200)
HDL: 46.2 mg/dL (ref >39.00)
LDL Cholesterol: 104 mg/dL — ABNORMAL HIGH (ref 0–99)
NonHDL: 115.49
TRIGLYCERIDES: 55 mg/dL (ref 0.0–149.0)
Total CHOL/HDL Ratio: 3
VLDL: 11 mg/dL (ref 0.0–40.0)

## 2017-04-30 LAB — COMPREHENSIVE METABOLIC PANEL
ALBUMIN: 4.5 g/dL (ref 3.5–5.2)
ALT: 15 U/L (ref 0–53)
AST: 20 U/L (ref 0–37)
Alkaline Phosphatase: 54 U/L (ref 39–117)
BILIRUBIN TOTAL: 0.7 mg/dL (ref 0.2–1.2)
BUN: 13 mg/dL (ref 6–23)
CALCIUM: 9.5 mg/dL (ref 8.4–10.5)
CO2: 25 mEq/L (ref 19–32)
CREATININE: 1.09 mg/dL (ref 0.40–1.50)
Chloride: 108 mEq/L (ref 96–112)
GFR: 90.53 mL/min (ref >60.00)
Glucose, Bld: 99 mg/dL (ref 70–99)
Potassium: 4.2 mEq/L (ref 3.5–5.1)
Sodium: 140 mEq/L (ref 135–145)
Total Protein: 6.9 g/dL (ref 6.0–8.3)

## 2017-04-30 MED ORDER — ALBUTEROL SULFATE HFA 108 (90 BASE) MCG/ACT IN AERS: 2.0000 | INHALATION_SPRAY | Freq: Four times a day (QID) | RESPIRATORY_TRACT | 1 refills | Status: DC | PRN

## 2017-04-30 NOTE — Addendum Note (Signed)
Addended by: Rivka SaferSOUTHERN HIZER, Asher MuirJAMIE M on: 04/30/2017 10:06 AM   Modules accepted: Orders

## 2017-04-30 NOTE — Progress Notes (Signed)
Phone: [phone]  Subjective:  Patient presents today for their annual physical. Chief complaint-noted.   See problem oriented charting- ROS- full  review of systems was completed and negative including No chest pain (other than months ago when seen in ED) or shortness of breath. No headache or blurry vision.   The following were reviewed and entered/updated in epic: Past Medical History:  Diagnosis Date  . ADHD (attention deficit hyperactivity disorder)    vyvanse 29m  . Asthma    exercise induced  . Depression    anxiety/history when on 738mvyvanse- improved with decreased dose.   . Migraines   . Tetralogy of Fallot    s/p repair. [facility] saw cardiology until about age 21-toldeturn 7 years.    Patient Active Problem List   Diagnosis Date Noted  . ADHD (attention deficit hyperactivity disorder)     Priority: High  . Tetralogy of Fallot     Priority: High  . Asthma     Priority: Medium  . Migraines     Priority: Medium  . Depression     Priority: Medium  . Acne 05/01/2015   Past Surgical History:  Procedure Laterality Date  . GANGLION CYST EXCISION  2014  . TETRALOGY OF FALLOT REPAIR      Family History  Problem Relation Age of Onset  . Other Mother        adopted  . Alcohol abuse Unknown        biological parents per adopted father    Medications- reviewed and updated Current Outpatient Prescriptions  Medication Sig Dispense Refill  . adapalene (DIFFERIN) 0.1 % gel Apply topically at bedtime. 45 g 5  . lisdexamfetamine (VYVANSE) 60 MG capsule Take 60 mg by mouth every morning.    . Marland Kitchenlbuterol (PROVENTIL HFA;VENTOLIN HFA) 108 (90 Base) MCG/ACT inhaler Inhale 2 puffs into the lungs every 6 (six) hours as needed for wheezing or shortness of breath. 1 Inhaler 1   No current facility-administered medications for this visit.     Allergies-reviewed and updated No Known Allergies  Social History   Social History  . Marital status: Single   Spouse name: N/A  . Number of children: N/A  . Years of education: N/A   Social History Main Topics  . Smoking status: Current Every Day Smoker    Packs/day: 0.50    Types: Cigarettes  . Smokeless tobacco: Never Used  . Alcohol use 0.6 oz/week    1 Standard drinks or equivalent per week     Comment: on occassion. Last beer at midnight tonight   . Drug use: No  . Sexual activity: Yes    Partners: Female    Birth control/ protection: Condom   Other Topics Concern  . None   Social History Narrative   Family: Single- new GF 09/2016. Adopted Son of [place]f replacements limited (also patient of Dr. 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 Oropharynx normal Neck: no thyromegaly CV: RRR no murmurs rubs or gallops Lungs: CTAB no crackles, wheeze, rhonchi Abdomen: soft/nontender/nondistended/normal bowel sounds. No rebound or guarding.  Ext: no edema Skin: warm, dry Neuro: grossly normal, moves all extremities, PERRLA  Assessment/Plan:  21 y.o. male presenting for annual physical.  Health Maintenance counseling: 1. Anticipatory guidance: Patient counseled regarding regular dental exams - q6 months encouraged, eye exams - has lowest level glasses- recently seen, wearing seatbelts.  2. Risk factor  reduction:  Advised patient of need for regular exercise and diet rich and fruits and vegetables to reduce risk of heart attack and stroke. Exercise- started lifting weights 4 months ago and lifts a lot with work- feels has gained muscle mass. Diet-states he has already lost about 10 lbs after improving diet- we decided to target 205-210 at least within 6 months and follow up then. .  Wt Readings from Last 3 Encounters:  04/30/17 220 lb 3.2 oz (99.9 kg)  01/21/17 205 lb (93 kg)  09/18/16 210 lb (95.3 kg)  3. Immunizations/screenings/ancillary studies- Tdap today  Immunization History  Administered Date(s) Administered  . DTaP 03/24/1996, 06/01/1996, 07/20/1996, 06/25/1997, 09/07/2000  . HPV Quadrivalent 05/16/2012, 07/20/2012, 11/29/2012  . Hepatitis A 05/04/2011, 05/16/2012  . Hepatitis B 07/05/1996, 02/22/1996, 12/11/1996  . HiB (PRP-OMP) 03/24/1996, 06/01/1996, 07/20/1996, 05/15/1997  . IPV 03/24/1996, 06/01/1996, 06/25/1997, 09/07/2000  . Influenza Nasal 07/31/2009, 08/12/2011, 07/20/2012  . Influenza,inj,Quad PF,36+ Mos 08/10/2016  . Influenza-Unspecified 08/27/2007, 08/08/2008, 09/21/2008, 09/04/2016  . MMR 06/15/1997, 11/03/2002  . Meningococcal Conjugate 05/04/2011  . Meningococcal Polysaccharide 05/25/2013  . Pneumococcal Polysaccharide-23 10/13/2014  . Tdap 01/24/2007  . Varicella 01/24/1997, 05/25/2013  4. Prostate cancer screening- unknown history as adopted- start around 50 5. Colon cancer screening - unknown history as adopted- start around 50 6. Skin cancer screening/prevention- advised regular sunscreen use.  7. Testicular cancer screening- advised monthly self exams - doing monthly 8. STD screening- patient opts out- monogamous relationship since December 2017- was tested before getting into relationship. Advised protected sex despite her IUD.  GF on track to got to PA school   Status of chronic or acute concerns   History tetralogy of fallot- repair at 19 months. [facility]  ped cards had told no regular follow up Had ED visit 3 months ago- pain with movement in chest- muscle relaxant helped and psychiatrist also thought panic could have been portion  ADHD through Dr. Osborne Oman of psychiatry. Doing well on 50m vyvanse. Anxiety on higher dose  Migraines- less than oncea  Month without prophylaxis  Asthma- exercise mainly and seems to have minimal issues- has had less and less issues  Had quit smoking in December but apparently has started back - has emergency pack now and smoking 1 day or less a week for 1 cigarette.   LDL elevation in past- with history will update labs  1 year CPE- discussed CPE sometimes need 1 year separation but he would prefer to do physical- hopeful once per calendar year for his insurance  Orders Placed This Encounter  Procedures  . CBC    [place]  . Comprehensive metabolic panel    [place]    Order Specific Question:   Has the patient fasted?    Answer:   No  .  Lipid panel    [place]    Order Specific Question:   Has the patient fasted?    Answer:   No    Meds ordered this encounter  Medications  . albuterol (PROVENTIL HFA;VENTOLIN HFA) 108 (90 Base) MCG/ACT inhaler    Sig: Inhale 2 puffs into the lungs every 6 (six) hours as needed for wheezing or shortness of breath.    Dispense:  1 Inhaler    Refill:  1    Return precautions advised.  Garret Reddish, MD

## 2017-04-30 NOTE — Patient Instructions (Addendum)
Tdap today.   In the last 3 months you have gained 15 pounds. This trend is very worrisome for your long term health although some of this is muscle gain which is a good thing. It is great that you have already lost 10 lbs as well.  I would love to see you exercising 150 minutes a week (sounds like you are doing this but would mix in some cardio for heart health). Encourage eating a diet low in processed foods and rich in vegetables and fruits. I would like to check in with you 3-6 months from now to see how you are doing with this- goal weight back down to at least 205-210 in that time frame Wt Readings from Last 3 Encounters:  04/30/17 220 lb 3.2 oz (99.9 kg)  01/21/17 205 lb (93 kg)   Quitting smoking is one of the best things you can do for your health- not even one cigarette once a week! Encourage no vaping either as it is a gateway right back to it

## 2017-05-03 ENCOUNTER — Telehealth: Payer: Self-pay | Admitting: Family Medicine

## 2017-05-03 NOTE — Telephone Encounter (Signed)
Jamie pt returned your call. °

## 2017-05-03 NOTE — Telephone Encounter (Signed)
Spoke with patient and reviewed over lab results 

## 2018-05-02 ENCOUNTER — Encounter: Payer: Self-pay | Admitting: Family Medicine

## 2018-05-02 ENCOUNTER — Ambulatory Visit (INDEPENDENT_AMBULATORY_CARE_PROVIDER_SITE_OTHER): Payer: No Typology Code available for payment source | Admitting: Family Medicine

## 2018-05-02 VITALS — BP 116/82 | HR 75 | Temp 98.4°F | Ht 70.25 in | Wt 264.4 lb

## 2018-05-02 DIAGNOSIS — J4520 Mild intermittent asthma, uncomplicated: Secondary | ICD-10-CM | POA: Diagnosis not present

## 2018-05-02 DIAGNOSIS — Z0000 Encounter for general adult medical examination without abnormal findings: Secondary | ICD-10-CM | POA: Diagnosis not present

## 2018-05-02 DIAGNOSIS — Z87891 Personal history of nicotine dependence: Secondary | ICD-10-CM | POA: Diagnosis not present

## 2018-05-02 DIAGNOSIS — M67432 Ganglion, left wrist: Secondary | ICD-10-CM

## 2018-05-02 DIAGNOSIS — Q213 Tetralogy of Fallot: Secondary | ICD-10-CM

## 2018-05-02 DIAGNOSIS — E785 Hyperlipidemia, unspecified: Secondary | ICD-10-CM

## 2018-05-02 DIAGNOSIS — L709 Acne, unspecified: Secondary | ICD-10-CM

## 2018-05-02 DIAGNOSIS — F900 Attention-deficit hyperactivity disorder, predominantly inattentive type: Secondary | ICD-10-CM

## 2018-05-02 LAB — POC URINALSYSI DIPSTICK (AUTOMATED)
Bilirubin, UA: NEGATIVE
Blood, UA: NEGATIVE
Glucose, UA: NEGATIVE
KETONES UA: NEGATIVE
LEUKOCYTES UA: NEGATIVE
Nitrite, UA: NEGATIVE
PROTEIN UA: NEGATIVE
UROBILINOGEN UA: 0.2 U/dL
pH, UA: 5 (ref 5.0–8.0)

## 2018-05-02 LAB — COMPREHENSIVE METABOLIC PANEL
ALBUMIN: 4.7 g/dL (ref 3.5–5.2)
ALK PHOS: 61 U/L (ref 39–117)
ALT: 22 U/L (ref 0–53)
AST: 16 U/L (ref 0–37)
BUN: 16 mg/dL (ref 6–23)
CALCIUM: 9.5 mg/dL (ref 8.4–10.5)
CHLORIDE: 104 meq/L (ref 96–112)
CO2: 27 mEq/L (ref 19–32)
Creatinine, Ser: 1.2 mg/dL (ref 0.40–1.50)
GFR: 80.26 mL/min (ref >60.00)
Glucose, Bld: 103 mg/dL — ABNORMAL HIGH (ref 70–99)
POTASSIUM: 3.9 meq/L (ref 3.5–5.1)
Sodium: 140 mEq/L (ref 135–145)
Total Bilirubin: 0.5 mg/dL (ref 0.2–1.2)
Total Protein: 7.6 g/dL (ref 6.0–8.3)

## 2018-05-02 LAB — LIPID PANEL
CHOLESTEROL: 185 mg/dL (ref 0–200)
HDL: 43.7 mg/dL (ref >39.00)
LDL CALC: 120 mg/dL — AB (ref 0–99)
NonHDL: 140.8
TRIGLYCERIDES: 106 mg/dL (ref 0.0–149.0)
Total CHOL/HDL Ratio: 4
VLDL: 21.2 mg/dL (ref 0.0–40.0)

## 2018-05-02 LAB — CBC
HCT: 45.8 % (ref 39.0–52.0)
HEMOGLOBIN: 15.8 g/dL (ref 13.0–17.0)
MCHC: 34.4 g/dL (ref 30.0–36.0)
MCV: 83.1 fl (ref 78.0–100.0)
PLATELETS: 257 10*3/uL (ref 150.0–400.0)
RBC: 5.52 Mil/uL (ref 4.22–5.81)
RDW: 13.3 % (ref 11.5–15.5)
WBC: 7.6 10*3/uL (ref 4.0–10.5)

## 2018-05-02 MED ORDER — ADAPALENE 0.1 % EX GEL: Freq: Every day | CUTANEOUS | 5 refills | Status: DC

## 2018-05-02 NOTE — Assessment & Plan Note (Signed)
Acne- refilled adaplene today

## 2018-05-02 NOTE — Assessment & Plan Note (Signed)
Asthma- has done really well since high school- has old rx on hand if needed for albuterol ut hasnt had to use in years

## 2018-05-02 NOTE — Assessment & Plan Note (Signed)
Migraines- less than once a  Month without prophylaxis. Doing well

## 2018-05-02 NOTE — Assessment & Plan Note (Signed)
Ganglion cyst- operated on years ago on left wrist- has recurred recently. Wants referral back to Dr. Melvyn Novasortmann

## 2018-05-02 NOTE — Assessment & Plan Note (Signed)
ADHD through Dr. Ivory Broadancey of psychiatry. Doing well on 60mg  vyvanse. Anxiety on higher dose

## 2018-05-02 NOTE — Patient Instructions (Addendum)
Please stop by lab before you go  We will call you within two weeks about your referral to Dr. Melvyn Novasrtmann. If you do not hear within 3 weeks, give us a call.   Need to work on getting back to 210 range- glad you are already starting by walking. I like your idea of walking after meals and cutting portion size to help lower overall caloric intake. I would also encourage 3 smaller meals per day.

## 2018-05-02 NOTE — Assessment & Plan Note (Signed)
History tetralogy of fallot- repair at 19 months. [facility] ped cards had told no regular follow up.

## 2018-05-02 NOTE — Addendum Note (Signed)
Addended by: Felix AhmadiFRANSEN, Fiorella Hanahan A on: 05/02/2018 10:18 AM   Modules accepted: Orders

## 2018-05-02 NOTE — Progress Notes (Signed)
Phone: [phone]  Subjective:  Patient presents today for their annual physical. Chief complaint-noted.   See problem oriented charting- ROS- full  review of systems was completed and negative except for recurrence of wrist cyst on left hand and some sensitivity there  The following were reviewed and entered/updated in epic: Past Medical History:  Diagnosis Date  . ADHD (attention deficit hyperactivity disorder)    vyvanse 18m  . Asthma    exercise induced  . Depression    anxiety/history when on 710mvyvanse- improved with decreased dose.   . Migraines   . Tetralogy of Fallot    s/p repair. [facility] saw cardiology until about age 22-toldeturn 7 years.    Patient Active Problem List   Diagnosis Date Noted  . ADHD (attention deficit hyperactivity disorder)     Priority: High  . Tetralogy of Fallot     Priority: High  . Asthma     Priority: Medium  . Migraines     Priority: Medium  . Depression     Priority: Medium  . Former smoker 05/02/2018    Priority: Low  . Acne 05/01/2015    Priority: Low  . Ganglion cyst of dorsum of left wrist 05/02/2018   Past Surgical History:  Procedure Laterality Date  . GANGLION CYST EXCISION  2014  . TETRALOGY OF FALLOT REPAIR      Family History  Problem Relation Age of Onset  . Other Mother        adopted  . Alcohol abuse Unknown        biological parents per adopted father    Medications- reviewed and updated Current Outpatient Medications  Medication Sig Dispense Refill  . adapalene (DIFFERIN) 0.1 % gel Apply topically at bedtime. 45 g 5  . albuterol (PROVENTIL HFA;VENTOLIN HFA) 108 (90 Base) MCG/ACT inhaler Inhale 2 puffs into the lungs every 6 (six) hours as needed for wheezing or shortness of breath. 1 Inhaler 1  . lisdexamfetamine (VYVANSE) 60 MG capsule Take 60 mg by mouth every morning.     No current facility-administered medications for this visit.     Allergies-reviewed and updated No Known  Allergies  Social History   Social History Narrative   Family: Engaged May 2019- wedding planned 2021 (before she starts PA school). Adopted Son of KeHope Pigeonf replacements limited (also patient of Dr. 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    Prior worked at [employer], concerts    Objective: BP 116/82 (BP Location: Left Arm, Patient Position: Sitting, Cuff Size: Normal)   Pulse 75   Temp 98.4 F (36.9 C) (Oral)   Ht 5' 10.25" (1.784 m)   Wt 264 lb 6.4 oz (119.9 kg)   SpO2 96%   BMI 37.67 kg/m  Gen: NAD, resting comfortably HEENT: Mucous membranes are moist. Oropharynx normal Neck: no thyromegaly CV: RRR no murmurs rubs or gallops Lungs: CTAB no crackles, wheeze, rhonchi Abdomen: soft/nontender/nondistended/normal bowel sounds. No rebound or guarding. obese Ext: no edema Skin: warm, dry, several tattoos noted Neuro: grossly normal, moves all extremities, PERRLA  Assessment/Plan:  [age].o. male presenting for annual physical.  Health Maintenance counseling: 1. Anticipatory guidance: Patient counseled regarding regular dental exams -q6 months, eye exams - he needs to get his readers- he may go back to sh[employer]ros, wearing seatbelts.  2. Risk factor reduction:  Advised patient of need for regular exercise and diet rich and fruits and vegetables to reduce risk of heart attack and stroke. Exercise- 3 days a week exercising- ice skating and going on walks. Diet-states got down to 215 but when he quit smoking he really jumped up in weight. He wants to cut portion size then go for walk after eating- states the cigarette used to curb appetite. He states weight has already started to gently trend down Wt Readings from Last 3 Encounters:  05/02/18 264 lb 6.4 oz (119.9 kg)  04/30/17 220 lb 3.2 oz (99.9 kg)  01/21/17 205 lb (93 kg)  3.  Immunizations/screenings/ancillary studies- up to date  Immunization History  Administered Date(s) Administered  . DTaP 03/24/1996, 06/01/1996, 07/20/1996, 06/25/1997, 09/07/2000  . HPV Quadrivalent 05/16/2012, 07/20/2012, 11/29/2012  . Hepatitis A 05/04/2011, 05/16/2012  . Hepatitis B 06/13/1996, 02/22/1996, 12/11/1996  . HiB (PRP-OMP) 03/24/1996, 06/01/1996, 07/20/1996, 05/15/1997  . IPV 03/24/1996, 06/01/1996, 06/25/1997, 09/07/2000  . Influenza Nasal 07/31/2009, 08/12/2011, 07/20/2012  . Influenza,inj,Quad PF,6+ Mos 08/10/2016, 08/06/2017  . Influenza-Unspecified 08/27/2007, 08/08/2008, 09/21/2008, 09/04/2016  . MMR 06/15/1997, 11/03/2002  . Meningococcal Conjugate 05/04/2011  . Meningococcal Polysaccharide 05/25/2013  . Pneumococcal Polysaccharide-23 10/13/2014  . Tdap 01/24/2007, 04/30/2017  . Varicella 01/24/1997, 05/25/2013  4. Prostate cancer screening-  asymptomatic. No known family history due to adoption. Will screen starting 55 likely  5. Colon cancer screening - asymptomatic. No known family history due to adoption. Will screen starting 45-50 likely 6. Skin cancer screening/prevention- no dermatologist. advised regular sunscreen use. Denies worrisome, changing, or new skin lesions.  7. Testicular cancer screening- advised monthly self exams  8. STD screening- patient opts out- monogamous   Status of chronic or acute concerns   Mild hyperlipidemia- will update labs today- discussed lifestyle changes/mediterranean type diet  Former smoker QUIT SMOKING in May- congratulated him on his efforts  Asthma Asthma- has done really well since high school- has old rx on hand if needed for albuterol ut hasnt had to use in years   ADHD (attention deficit hyperactivity disorder) ADHD through Dr. Osborne Oman of psychiatry. Doing well on 63m vyvanse. Anxiety on higher dose  Tetralogy of Fallot History tetralogy of fallot- repair at 89months. [facility] ped cards had told no regular follow  up.  Migraines Migraines- less than once a  Month without prophylaxis. Doing well  Acne Acne- refilled adaplene today  Ganglion cyst of dorsum of left wrist Ganglion cyst- operated on years ago on left wrist- has recurred recently. Wants referral back to Dr. oCaralyn Guile  1 year CPE  Lab/Order associations: Preventative health care - Plan: POCT Urinalysis Dipstick (Automated)  Mild hyperlipidemia - Plan: CBC, Comprehensive metabolic panel, Lipid panel, POCT Urinalysis Dipstick (Automated)  Former smoker - Plan: POCT Urinalysis Dipstick (Automated)  Mild intermittent asthma without complication  Attention deficit hyperactivity disorder (ADHD), predominantly inattentive type  Tetralogy of Fallot  Acne, unspecified acne type  Ganglion cyst of dorsum of left wrist - Plan: Ambulatory referral to Orthopedics  Meds ordered this encounter  Medications  . adapalene (DIFFERIN) 0.1 % gel    Sig: Apply topically at bedtime.    Dispense:  45 g    Refill:  5   Return precautions advised.   SGarret Reddish MD

## 2018-05-02 NOTE — Assessment & Plan Note (Signed)
QUIT SMOKING in May- congratulated him on his efforts

## 2019-05-03 NOTE — Patient Instructions (Addendum)
Please stop by lab before you go If you do not have mychart- we will call you about results within 5 business days of [place] receiving them.  If you have mychart- we will send your results within 3 business days of [place] receiving them.  If abnormal or we want to clarify a result, we will call or mychart you to make sure you receive the message.  If you have questions or concerns or don't hear within 5-7 days, please send [place] a message or call us.     congrats on losing 15 lbs from your peak- lets continue your weight loss journey with long term goal 210 but lets try to get to under 250 next year at minimum- I like your 235 goal as well

## 2019-05-03 NOTE — Progress Notes (Signed)
Phone: [phone]    Subjective:  Patient presents today for their annual physical. Chief complaint-noted.   See problem oriented charting- ROS- full  review of systems was completed and negative including No chest pain or shortness of breath. No headache or blurry vision. Acne better lately  The following were reviewed and entered/updated in epic: Past Medical History:  Diagnosis Date  . ADHD (attention deficit hyperactivity disorder)    vyvanse 85m  . Asthma    exercise induced  . Depression    anxiety/history when on 757mvyvanse- improved with decreased dose.   . Migraines   . Tetralogy of Fallot    s/p repair. [facility] saw cardiology until about age 23-toldeturn 7 years.    Patient Active Problem List   Diagnosis Date Noted  . ADHD (attention deficit hyperactivity disorder)     Priority: High  . Tetralogy of Fallot     Priority: High  . Asthma     Priority: Medium  . Migraines     Priority: Medium  . Depression     Priority: Medium  . Former smoker 05/02/2018    Priority: Low  . Acne 05/01/2015    Priority: Low  . Ganglion cyst of dorsum of left wrist 05/02/2018   Past Surgical History:  Procedure Laterality Date  . GANGLION CYST EXCISION  2014  . TETRALOGY OF FALLOT REPAIR      Family History  Problem Relation Age of Onset  . Other Mother        adopted  . Alcohol abuse Other        biological parents per adopted father    Medications- reviewed and updated Current Outpatient Medications  Medication Sig Dispense Refill  . lisdexamfetamine (VYVANSE) 60 MG capsule Take 60 mg by mouth every morning. Patient has not taking in 42m81month   No current facility-administered medications for this visit.     Allergies-reviewed and updated No Known Allergies  Social History   Social History Narrative   Family: Engaged May 2019- wedding considering 2020 but concerned with covid (before she starts PA school). Adopted Son of KelHope Pigeon  replacements limited (also patient of Dr. 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    real estate plans delayed.    Prior worked at [employer], concerts      Objective:  BP 110/78   Pulse 78   Temp 98.2 F (36.8 C) (Oral)   Ht 5' 10.5" (1.791 m)   Wt 275 lb 9.6 oz (125 kg)   SpO2 97%   BMI 38.99 kg/m  Gen: NAD, resting comfortably HEENT: Mucous membranes are moist. Oropharynx normal Neck: no thyromegaly or cervical lymphadenopathy CV: RRR no murmurs rubs or gallops Lungs: CTAB no crackles, wheeze, rhonchi Abdomen: soft/nontender/nondistended/normal bowel sounds. No rebound or guarding.  Ext: no edema and 2+ PT pulses Skin: warm, dry Neuro: grossly normal, moves all extremities, PERRLA     Assessment and Plan:  [age]. male presenting for annual physical.  Health Maintenance counseling: 1. Anticipatory guidance: Patient counseled regarding regular dental exams -q6 months typically outside of covid 19, eye exams-saw optho but he states glasses were too small for his head,  avoiding smoking and second hand smoke , limiting alcohol to 2 beverages per day- much less than that.  2. Risk factor reduction:  Advised patient of need for regular exercise and diet rich and fruits and vegetables to reduce risk of heart attack and stroke. Exercise-at least 3 days/week last year- missing the gym now- but just started some walking/hiking. Diet-weight gain last year after quitting smoking as cigarettes curb appetite-  Goal last year was to work to get back down to 210- he has gone up 11 lbs with covid 19- has already started turning things around as was over 10 lbs heavier previously- has cut down on snacking.  Wt Readings from Last 3 Encounters:  05/04/19 275 lb 9.6 oz (125 kg)  05/02/18 264 lb 6.4 oz (119.9 kg)  04/30/17 220 lb 3.2 oz (99.9 kg)  3.  Immunizations/screenings/ancillary studies-fully up-to-date  Immunization History  Administered Date(s) Administered  . DTaP 03/24/1996, 06/01/1996, 07/20/1996, 06/25/1997, 09/07/2000  . HPV Quadrivalent 05/16/2012, 07/20/2012, 11/29/2012  . Hepatitis A 05/04/2011, 05/16/2012  . Hepatitis B 01-15-1996, 02/22/1996, 12/11/1996  . HiB (PRP-OMP) 03/24/1996, 06/01/1996, 07/20/1996, 05/15/1997  . IPV 03/24/1996, 06/01/1996, 06/25/1997, 09/07/2000  . Influenza Nasal 07/31/2009, 08/12/2011, 07/20/2012  . Influenza,inj,Quad PF,6+ Mos 08/10/2016, 08/06/2017  . Influenza-Unspecified 08/27/2007, 08/08/2008, 09/21/2008, 09/04/2016  . MMR 06/15/1997, 11/03/2002  . Meningococcal Conjugate 05/04/2011  . Meningococcal Polysaccharide 05/25/2013  . Pneumococcal Polysaccharide-23 10/13/2014  . Tdap 01/24/2007, 04/30/2017  . Varicella 01/24/1997, 05/25/2013  4. Prostate cancer screening-no known family history due to adoption.  Asymptomatic-likely start at age 67  5. Colon cancer screening -asymptomatic.  No family history known due to adoption.  Likely start screening at 45-50 6. Skin cancer screening/prevention-no regular dermatologist. advised regular sunscreen use. Denies worrisome, changing, or new skin lesions.  7. Testicular cancer screening- advised monthly self exams  8. STD screening- patient opts out as monogamous with fiancee 9. former smoker-quit smoking in May 2019.  Status of chronic or acute concerns  ADHD - Taking Vyvanse 60 mg daily with psychiatry but has not in about in 6 months. Was holding off as transitioning between psychiatrists which is tough with covid  Asthma - Has Albuterol HFA to use prn for flare-ups- does not want refill as has had no issues.  Has really done well since high school  Acne - Using Differin 0.1% gel.  Does not need refill as doing better.   Mild hyperlipidemia-update lipids today- continue to work on lifestyle modifications and hold off and on statin unless  LDL gets above 190  Tetralogy of flow-history of tetralogy of flow repair at 71 months of age.  Was told no regular follow-up through [facility] pediatric cardiology.  Migraines- typically less than once a month-does not use prophylaxis- has been even less frequent this year  Referred back to Dr. Caralyn Guile last year for ganglion cyst on the left wrist-had another repair    Depression-remains in full remission.  PHQ 9 updated today and <5- continue to monitor yearly  Depression screen [facility] 2/9 05/04/2019 05/02/2018  Decreased Interest 0 0  Down, Depressed, Hopeless 0 0  PHQ - 2 Score 0 0  Altered sleeping 1 1  Tired, decreased energy 1 1  Change in appetite 1 0  Feeling bad or failure about yourself  0 0  Trouble concentrating 0 0  Moving slowly or fidgety/restless 0 0  Suicidal thoughts 0 0  PHQ-9 Score 3 2  Difficult doing work/chores Not difficult at all Not difficult at all   Recommended follow up: 1 year cpe  Lab/Order associations: fasting    ICD-10-CM   1. Preventative health  care  Z00.00 CBC    Comprehensive metabolic panel    Lipid panel    POCT Urinalysis Dipstick (Automated)  2. Mild hyperlipidemia  E78.5 CBC    Comprehensive metabolic panel    Lipid panel    POCT Urinalysis Dipstick (Automated)  3. Obesity (BMI 30-39.9)  E66.9     Return precautions advised.  Garret Reddish, MD

## 2019-05-04 ENCOUNTER — Other Ambulatory Visit: Payer: Self-pay

## 2019-05-04 ENCOUNTER — Encounter: Payer: Self-pay | Admitting: Family Medicine

## 2019-05-04 ENCOUNTER — Ambulatory Visit (INDEPENDENT_AMBULATORY_CARE_PROVIDER_SITE_OTHER): Payer: No Typology Code available for payment source | Admitting: Family Medicine

## 2019-05-04 VITALS — BP 110/78 | HR 78 | Temp 98.2°F | Ht 70.5 in | Wt 275.6 lb

## 2019-05-04 DIAGNOSIS — E785 Hyperlipidemia, unspecified: Secondary | ICD-10-CM

## 2019-05-04 DIAGNOSIS — Z0000 Encounter for general adult medical examination without abnormal findings: Secondary | ICD-10-CM

## 2019-05-04 DIAGNOSIS — E669 Obesity, unspecified: Secondary | ICD-10-CM | POA: Diagnosis not present

## 2019-05-04 LAB — LIPID PANEL
Cholesterol: 211 mg/dL — ABNORMAL HIGH (ref 0–200)
HDL: 41.9 mg/dL (ref >39.00)
LDL Cholesterol: 143 mg/dL — ABNORMAL HIGH (ref 0–99)
NonHDL: 169.03
Total CHOL/HDL Ratio: 5
Triglycerides: 129 mg/dL (ref 0.0–149.0)
VLDL: 25.8 mg/dL (ref 0.0–40.0)

## 2019-05-04 LAB — CBC
HCT: 48.3 % (ref 39.0–52.0)
Hemoglobin: 16.4 g/dL (ref 13.0–17.0)
MCHC: 33.9 g/dL (ref 30.0–36.0)
MCV: 83.1 fl (ref 78.0–100.0)
Platelets: 280 10*3/uL (ref 150.0–400.0)
RBC: 5.81 Mil/uL (ref 4.22–5.81)
RDW: 13.1 % (ref 11.5–15.5)
WBC: 8.2 10*3/uL (ref 4.0–10.5)

## 2019-05-04 LAB — COMPREHENSIVE METABOLIC PANEL
ALT: 21 U/L (ref 0–53)
AST: 19 U/L (ref 0–37)
Albumin: 4.9 g/dL (ref 3.5–5.2)
Alkaline Phosphatase: 66 U/L (ref 39–117)
BUN: 14 mg/dL (ref 6–23)
CO2: 26 mEq/L (ref 19–32)
Calcium: 9.8 mg/dL (ref 8.4–10.5)
Chloride: 103 mEq/L (ref 96–112)
Creatinine, Ser: 1.1 mg/dL (ref 0.40–1.50)
GFR: 82.75 mL/min (ref >60.00)
Glucose, Bld: 90 mg/dL (ref 70–99)
Potassium: 4.1 mEq/L (ref 3.5–5.1)
Sodium: 140 mEq/L (ref 135–145)
Total Bilirubin: 0.7 mg/dL (ref 0.2–1.2)
Total Protein: 7.6 g/dL (ref 6.0–8.3)

## 2019-05-04 LAB — POC URINALSYSI DIPSTICK (AUTOMATED)
Bilirubin, UA: NEGATIVE
Blood, UA: NEGATIVE
Glucose, UA: NEGATIVE
Ketones, UA: NEGATIVE
Leukocytes, UA: NEGATIVE
Nitrite, UA: NEGATIVE
Protein, UA: NEGATIVE
Spec Grav, UA: >=1.03 — AB (ref 1.010–1.025)
Urobilinogen, UA: 0.2 E.U./dL
pH, UA: 6 (ref 5.0–8.0)

## 2019-05-04 NOTE — Addendum Note (Signed)
Addended by: Francis Dowse T on: 05/04/2019 09:01 AM   Modules accepted: Orders

## 2019-07-19 ENCOUNTER — Other Ambulatory Visit: Payer: Self-pay

## 2019-07-19 DIAGNOSIS — Z20822 Contact with and (suspected) exposure to covid-19: Secondary | ICD-10-CM

## 2019-07-21 LAB — NOVEL CORONAVIRUS, NAA: SARS-CoV-2, NAA: NOT DETECTED

## 2019-07-24 ENCOUNTER — Encounter: Payer: Self-pay | Admitting: Family Medicine

## 2019-12-02 ENCOUNTER — Other Ambulatory Visit: Payer: Self-pay

## 2019-12-02 DIAGNOSIS — Z20822 Contact with and (suspected) exposure to covid-19: Secondary | ICD-10-CM

## 2019-12-03 LAB — NOVEL CORONAVIRUS, NAA: SARS-CoV-2, NAA: NOT DETECTED

## 2019-12-15 ENCOUNTER — Ambulatory Visit: Payer: No Typology Code available for payment source | Attending: Internal Medicine

## 2019-12-15 DIAGNOSIS — Z23 Encounter for immunization: Secondary | ICD-10-CM

## 2019-12-15 NOTE — Progress Notes (Signed)
   Covid-19 Vaccination Clinic  Name:  Victory Strollo    MRN: [id number] DOB: [birth year]  12/15/2019  Mr. Poteat-Smith was observed post Covid-19 immunization for 15 minutes without incident. He was provided with Vaccine Information Sheet and instruction to access the V-Safe system.   Mr. Bologna was instructed to call 911 with any severe reactions post vaccine: Marland Kitchen Difficulty breathing  . Swelling of face and throat  . A fast heartbeat  . A bad rash all over body  . Dizziness and weakness

## 2020-01-10 ENCOUNTER — Ambulatory Visit: Payer: Self-pay | Attending: Internal Medicine

## 2020-01-10 DIAGNOSIS — Z23 Encounter for immunization: Secondary | ICD-10-CM

## 2020-01-10 NOTE — Progress Notes (Signed)
   Covid-19 Vaccination Clinic  Name:  Carlos Jacobs    MRN: [id number] DOB: 03/16/1996  01/10/2020  Carlos Jacobs was observed post Covid-19 immunization for 15 minutes without incident. He was provided with Vaccine Information Sheet and instruction to access the V-Safe system.   Carlos Jacobs was instructed to call 911 with any severe reactions post vaccine: Marland Kitchen Difficulty breathing  . Swelling of face and throat  . A fast heartbeat  . A bad rash all over body  . Dizziness and weakness   Immunizations Administered    Name Date Dose VIS Date Route   Pfizer COVID-19 Vaccine 01/10/2020  8:57 AM 0.3 mL 09/22/2019 Intramuscular   Manufacturer: [employer], [employer]   Lot: [id number]   NDC: [address]-4

## 2020-01-16 ENCOUNTER — Ambulatory Visit: Payer: No Typology Code available for payment source

## 2020-05-06 ENCOUNTER — Encounter: Payer: No Typology Code available for payment source | Admitting: Family Medicine

## 2020-08-12 ENCOUNTER — Encounter: Payer: Self-pay | Admitting: Family Medicine

## 2021-03-25 ENCOUNTER — Encounter: Payer: Self-pay | Admitting: Family Medicine

## 2021-04-05 ENCOUNTER — Encounter (HOSPITAL_BASED_OUTPATIENT_CLINIC_OR_DEPARTMENT_OTHER): Payer: Self-pay | Admitting: *Deleted

## 2021-04-05 ENCOUNTER — Emergency Department (HOSPITAL_BASED_OUTPATIENT_CLINIC_OR_DEPARTMENT_OTHER): Payer: No Typology Code available for payment source

## 2021-04-05 ENCOUNTER — Other Ambulatory Visit: Payer: Self-pay

## 2021-04-05 ENCOUNTER — Inpatient Hospital Stay (HOSPITAL_BASED_OUTPATIENT_CLINIC_OR_DEPARTMENT_OTHER)
Admission: EM | Admit: 2021-04-05 | Discharge: 2021-04-07 | DRG: 065 | Disposition: A | Discharge status: Home / Self Care | Payer: No Typology Code available for payment source | Attending: Family Medicine | Admitting: Family Medicine

## 2021-04-05 DIAGNOSIS — Z8774 Personal history of (corrected) congenital malformations of heart and circulatory system: Secondary | ICD-10-CM

## 2021-04-05 DIAGNOSIS — R29898 Other symptoms and signs involving the musculoskeletal system: Secondary | ICD-10-CM

## 2021-04-05 DIAGNOSIS — F419 Anxiety disorder, unspecified: Secondary | ICD-10-CM | POA: Diagnosis present

## 2021-04-05 DIAGNOSIS — Z8673 Personal history of transient ischemic attack (TIA), and cerebral infarction without residual deficits: Secondary | ICD-10-CM | POA: Diagnosis present

## 2021-04-05 DIAGNOSIS — F1010 Alcohol abuse, uncomplicated: Secondary | ICD-10-CM | POA: Diagnosis present

## 2021-04-05 DIAGNOSIS — F909 Attention-deficit hyperactivity disorder, unspecified type: Secondary | ICD-10-CM | POA: Diagnosis present

## 2021-04-05 DIAGNOSIS — Q211 Atrial septal defect: Secondary | ICD-10-CM

## 2021-04-05 DIAGNOSIS — G8191 Hemiplegia, unspecified affecting right dominant side: Secondary | ICD-10-CM | POA: Diagnosis present

## 2021-04-05 DIAGNOSIS — E669 Obesity, unspecified: Secondary | ICD-10-CM | POA: Diagnosis present

## 2021-04-05 DIAGNOSIS — I63412 Cerebral infarction due to embolism of left middle cerebral artery: Secondary | ICD-10-CM | POA: Diagnosis not present

## 2021-04-05 DIAGNOSIS — I639 Cerebral infarction, unspecified: Secondary | ICD-10-CM | POA: Diagnosis present

## 2021-04-05 DIAGNOSIS — R29700 NIHSS score 0: Secondary | ICD-10-CM | POA: Diagnosis present

## 2021-04-05 DIAGNOSIS — F32A Depression, unspecified: Secondary | ICD-10-CM | POA: Diagnosis present

## 2021-04-05 DIAGNOSIS — F17290 Nicotine dependence, other tobacco product, uncomplicated: Secondary | ICD-10-CM | POA: Diagnosis present

## 2021-04-05 DIAGNOSIS — Z6839 Body mass index (BMI) 39.0-39.9, adult: Secondary | ICD-10-CM

## 2021-04-05 DIAGNOSIS — J45909 Unspecified asthma, uncomplicated: Secondary | ICD-10-CM | POA: Diagnosis present

## 2021-04-05 DIAGNOSIS — R7303 Prediabetes: Secondary | ICD-10-CM | POA: Diagnosis present

## 2021-04-05 DIAGNOSIS — G43909 Migraine, unspecified, not intractable, without status migrainosus: Secondary | ICD-10-CM | POA: Diagnosis present

## 2021-04-05 DIAGNOSIS — I10 Essential (primary) hypertension: Secondary | ICD-10-CM | POA: Diagnosis present

## 2021-04-05 DIAGNOSIS — Z20822 Contact with and (suspected) exposure to covid-19: Secondary | ICD-10-CM | POA: Diagnosis present

## 2021-04-05 DIAGNOSIS — E785 Hyperlipidemia, unspecified: Secondary | ICD-10-CM | POA: Diagnosis present

## 2021-04-05 NOTE — Discharge Instructions (Addendum)
1) take aspirin and plavix daily 2) Out of work until cleared by Dr. Excell Seltzer 3) Work note has been given to you 4) Smoking cessation 5) Take statin 6) Follow up with PCP 7) OT for right hand rehab referral made 8) Follow up with Dr Excell Seltzer for PFO closure 9) Follow up with neurology

## 2021-04-05 NOTE — ED Triage Notes (Signed)
Throughout the day has had weakness in rt fingers index, middle and ring finger. It goes and comes.

## 2021-04-05 NOTE — ED Notes (Signed)
Patient transported to CT 

## 2021-04-05 NOTE — ED Notes (Signed)
States woke up this am with right hand weakness.  Weak grip and unable to hold anything in hand.  Having difficulty touching finger to thumb.   Denies pain, numbness or tingling. Has full ROM to elbow and shoulder with good strength. Cap refill less than 3 sec.

## 2021-04-05 NOTE — ED Provider Notes (Signed)
[facility] EMERGENCY DEPT Provider Note   CSN: [id number] Arrival date & time: 04/05/21  1840     History Chief Complaint  Patient presents with   Hand Problem    Carlos Jacobs is a 25 y.o. male.  HPI Patient woke up with difficulty moving his dominant right hand this morning.  States he felt he slept on it but is not numb.  Just having difficulty using it.  States he had it with his left hand today because the right is just not working right.  Appears that is mostly in the hand.  States he feels stronger in the rest of the arm.  Does not have numbness or weakness in it.  Has not had episodes like this before.  No headache.  No neck pain.  Has not had episodes like this before.  Has had previous ganglion cyst on the left wrist and was wondering if this could be something like that.     Past Medical History:  Diagnosis Date   ADHD (attention deficit hyperactivity disorder)    vyvanse 60mg    Asthma    exercise induced   Depression    anxiety/history when on 70mg  vyvanse- improved with decreased dose.    Migraines    Tetralogy of Fallot    s/p repair. [facility] saw cardiology until about age 25-told return 7 years.     Patient Active Problem List   Diagnosis Date Noted   Former smoker 05/02/2018   Ganglion cyst of dorsum of left wrist 05/02/2018   Acne 05/01/2015   Asthma    Migraines    Depression    ADHD (attention deficit hyperactivity disorder)    Tetralogy of Fallot     Past Surgical History:  Procedure Laterality Date   GANGLION CYST EXCISION  2014   TETRALOGY OF FALLOT REPAIR         Family History  Problem Relation Age of Onset   Other Mother        adopted   Alcohol abuse Other        biological parents per adopted father    Social History   Tobacco Use   Smoking status: Former    Packs/day: 0.50    Pack years: 0.00    Types: Cigarettes    Start date: 10/12/2013    Quit date: 02/09/2018    Years since quitting: 3.1    Smokeless tobacco: Never  Vaping Use   Vaping Use: Never used  Substance Use Topics   Alcohol use: Yes    Alcohol/week: 1.0 standard drink    Types: 1 Standard drinks or equivalent per week    Comment: on occassion. Last beer at midnight tonight    Drug use: No    Home Medications Prior to Admission medications   Medication Sig Start Date End Date Taking? Authorizing Provider  lisdexamfetamine (VYVANSE) 60 MG capsule Take 60 mg by mouth every morning. Patient has not taking in 28months    [provider]    Allergies    Patient has no known allergies.  Review of Systems   Review of Systems  Constitutional:  Negative for appetite change.  Respiratory:  Negative for shortness of breath.   Cardiovascular:  Negative for leg swelling.  Gastrointestinal:  Negative for abdominal pain.  Genitourinary:  Negative for flank pain.  Musculoskeletal:  Negative for arthralgias.  Skin:  Negative for wound.  Neurological:  Positive for weakness. Negative for facial asymmetry.  Psychiatric/Behavioral:  Negative for confusion.  Physical Exam Updated Vital Signs BP 121/69 (BP Location: Right Arm)   Pulse 68   Temp 98.4 F (36.9 C)   Resp 15   Ht 5\' 10"  (1.778 m)   Wt 117.9 kg   SpO2 97%   BMI 37.31 kg/m   Physical Exam Vitals and nursing note reviewed.  HENT:     Head: Atraumatic.  Eyes:     Extraocular Movements: Extraocular movements intact.     Pupils: Pupils are equal, round, and reactive to light.  Cardiovascular:     Rate and Rhythm: Regular rhythm.  Pulmonary:     Breath sounds: No wheezing.  Abdominal:     Tenderness: There is no abdominal tenderness.  Musculoskeletal:        General: No tenderness.     Cervical back: Neck supple.  Skin:    General: Skin is warm.     Capillary Refill: Capillary refill takes less than 2 seconds.  Neurological:     Mental Status: He is alert and oriented to person, place, and time.     Comments: Eye movements intact.   Finger-nose intact bilaterally.  Normal strength and sensation left upper extremity.  Normal sensation left upper extremity including over all distributions of the hand, however has decreased flexion and extension at the third fourth and fifth fingers particularly.  Although it appears somewhat weaker on the first and second also.  There is a mild weakness to flexion and extension at the wrist but it is almost clonic with it.  Good strength at elbow and shoulder.  No elbow tenderness.Able to do some to individual fingertips although difficulty doing it to the more medial fingers on the right hand.    ED Results / Procedures / Treatments   Labs (all labs ordered are listed, but only abnormal results are displayed) Labs Reviewed - No data to display  EKG None  Radiology CT Head Wo Contrast  Result Date: 04/05/2021 CLINICAL DATA:  Woke with right hand weakness, difficulty testing finger to thumb EXAM: CT HEAD WITHOUT CONTRAST TECHNIQUE: Contiguous axial images were obtained from the base of the skull through the vertex without intravenous contrast. COMPARISON:  None. FINDINGS: Brain: No evidence of acute infarction, hemorrhage, hydrocephalus, extra-axial collection, visible mass lesion or mass effect. Vascular: No hyperdense vessel or unexpected calcification. Skull: No calvarial fracture or suspicious osseous lesion. No scalp swelling or hematoma. Sinuses/Orbits: Paranasal sinuses and mastoid air cells are predominantly clear. Included orbital structures are unremarkable. Other: None. IMPRESSION: No acute intracranial abnormality. If there is persisting concern for acute infarction, MRI is more sensitive and specific for early features of ischemia. Electronically Signed   By: 04/07/2021 M.D.   On: 04/05/2021 23:19    Procedures Procedures   Medications Ordered in ED Medications - No data to display  ED Course  I have reviewed the triage vital signs and the nursing notes.  Pertinent labs &  imaging results that were available during my care of the patient were reviewed by me and considered in my medical decision making (see chart for details).    MDM Rules/Calculators/A&P                         Patient with weakness of the right hand.  Woke with it this morning.  Not a tPA candidate due to time of onset and no LVO.  Sensation intact but does appear to have some muscle deficits.  It is his dominant hand  and he states he had to eat breakfast with the other hand.  Exam does not show a clear lesion as the cause.  Somewhat over medial hand but also some involvement of wrist for flexion extension.  Head CT reassuring.  Curb sided Dr. Amada Jupiter over the phone.    We think the next best course of action is MRI of the brain without contrast.  If shows lesion that can be dealt with.  If nothing shows up outpatient follow-up with neurology for possible nerve conduction study for possible ulnar neuropathy.  We will transfer by patient's own vehicle with family member driving.  Discussed with Dr. Stevie Kern, who accepted the patient in transfer  Final Clinical Impression(s) / ED Diagnoses Final diagnoses:  Weakness of right hand    Rx / DC Orders ED Discharge Orders     None        Benjiman Core, MD 04/06/21 0009

## 2021-04-05 NOTE — ED Notes (Signed)
Pt has left facility to [facility] at this time

## 2021-04-06 ENCOUNTER — Other Ambulatory Visit: Payer: Self-pay

## 2021-04-06 ENCOUNTER — Observation Stay (HOSPITAL_BASED_OUTPATIENT_CLINIC_OR_DEPARTMENT_OTHER): Payer: No Typology Code available for payment source

## 2021-04-06 ENCOUNTER — Observation Stay (HOSPITAL_COMMUNITY): Payer: No Typology Code available for payment source

## 2021-04-06 ENCOUNTER — Emergency Department (HOSPITAL_COMMUNITY): Payer: No Typology Code available for payment source

## 2021-04-06 DIAGNOSIS — E7800 Pure hypercholesterolemia, unspecified: Secondary | ICD-10-CM

## 2021-04-06 DIAGNOSIS — I639 Cerebral infarction, unspecified: Secondary | ICD-10-CM | POA: Diagnosis not present

## 2021-04-06 DIAGNOSIS — Q213 Tetralogy of Fallot: Secondary | ICD-10-CM

## 2021-04-06 DIAGNOSIS — I63412 Cerebral infarction due to embolism of left middle cerebral artery: Principal | ICD-10-CM

## 2021-04-06 DIAGNOSIS — I6389 Other cerebral infarction: Secondary | ICD-10-CM

## 2021-04-06 LAB — RAPID URINE DRUG SCREEN, HOSP PERFORMED
Amphetamines: NOT DETECTED
Barbiturates: NOT DETECTED
Benzodiazepines: NOT DETECTED
Cocaine: NOT DETECTED
Opiates: NOT DETECTED
Tetrahydrocannabinol: NOT DETECTED

## 2021-04-06 LAB — CBC WITH DIFFERENTIAL/PLATELET
Abs Immature Granulocytes: 0.04 10*3/uL (ref 0.00–0.07)
Basophils Absolute: 0 10*3/uL (ref 0.0–0.1)
Basophils Relative: 1 %
Eosinophils Absolute: 0.1 10*3/uL (ref 0.0–0.5)
Eosinophils Relative: 2 %
HCT: 46.9 % (ref 39.0–52.0)
Hemoglobin: 15.8 g/dL (ref 13.0–17.0)
Immature Granulocytes: 1 %
Lymphocytes Relative: 45 %
Lymphs Abs: 4.1 10*3/uL — ABNORMAL HIGH (ref 0.7–4.0)
MCH: 28.3 pg (ref 26.0–34.0)
MCHC: 33.7 g/dL (ref 30.0–36.0)
MCV: 83.9 fL (ref 80.0–100.0)
Monocytes Absolute: 0.7 10*3/uL (ref 0.1–1.0)
Monocytes Relative: 8 %
Neutro Abs: 3.8 10*3/uL (ref 1.7–7.7)
Neutrophils Relative %: 43 %
Platelets: 264 10*3/uL (ref 150–400)
RBC: 5.59 MIL/uL (ref 4.22–5.81)
RDW: 12.3 % (ref 11.5–15.5)
WBC: 8.8 10*3/uL (ref 4.0–10.5)
nRBC: 0 % (ref 0.0–0.2)

## 2021-04-06 LAB — BASIC METABOLIC PANEL
Anion gap: 11 (ref 5–15)
BUN: 14 mg/dL (ref 6–20)
CO2: 23 mmol/L (ref 22–32)
Calcium: 9.3 mg/dL (ref 8.9–10.3)
Chloride: 104 mmol/L (ref 98–111)
Creatinine, Ser: 1.07 mg/dL (ref 0.61–1.24)
GFR, Estimated: >60 mL/min (ref >60)
Glucose, Bld: 96 mg/dL (ref 70–99)
Potassium: 3.9 mmol/L (ref 3.5–5.1)
Sodium: 138 mmol/L (ref 135–145)

## 2021-04-06 LAB — ECHOCARDIOGRAM COMPLETE
AR max vel: 3.78 cm2
AV Area VTI: 3.56 cm2
AV Area mean vel: 3.61 cm2
AV Mean grad: 2 mmHg
AV Peak grad: 4.2 mmHg
Ao pk vel: 1.02 m/s
Area-P 1/2: 3.3 cm2
MV VTI: 2.71 cm2
S' Lateral: 2.9 cm

## 2021-04-06 LAB — LIPID PANEL
Cholesterol: 213 mg/dL — ABNORMAL HIGH (ref 0–200)
HDL: 38 mg/dL — ABNORMAL LOW (ref >40)
LDL Cholesterol: 147 mg/dL — ABNORMAL HIGH (ref 0–99)
Total CHOL/HDL Ratio: 5.6 RATIO
Triglycerides: 141 mg/dL (ref <150)
VLDL: 28 mg/dL (ref 0–40)

## 2021-04-06 LAB — SARS CORONAVIRUS 2 (TAT 6-24 HRS): SARS Coronavirus 2: NEGATIVE

## 2021-04-06 MED ORDER — STROKE: EARLY STAGES OF RECOVERY BOOK: Freq: Once | Status: DC

## 2021-04-06 MED ORDER — CLOPIDOGREL BISULFATE 75 MG PO TABS
75.0000 mg | ORAL_TABLET | Freq: Every day | ORAL | Status: DC
  Filled 2021-04-06: qty 1

## 2021-04-06 MED ORDER — CLOPIDOGREL BISULFATE 300 MG PO TABS
300.0000 mg | ORAL_TABLET | Freq: Once | ORAL | Status: AC
  Administered 2021-04-06: 300 mg via ORAL
  Filled 2021-04-06: qty 1

## 2021-04-06 MED ORDER — GADOBUTROL 1 MMOL/ML IV SOLN
10.0000 mL | Freq: Once | INTRAVENOUS | Status: AC | PRN
  Administered 2021-04-06: 03:00:00 10 mL via INTRAVENOUS

## 2021-04-06 MED ORDER — MELATONIN 3 MG PO TABS
3.0000 mg | ORAL_TABLET | Freq: Every evening | ORAL | Status: DC | PRN
  Administered 2021-04-06: 3 mg via ORAL
  Filled 2021-04-06: qty 1

## 2021-04-06 MED ORDER — LACTATED RINGERS IV SOLN: INTRAVENOUS | Status: DC

## 2021-04-06 MED ORDER — POLYETHYLENE GLYCOL 3350 17 G PO PACK: 17.0000 g | PACK | Freq: Every day | ORAL | Status: DC | PRN

## 2021-04-06 MED ORDER — ASPIRIN 81 MG PO CHEW
324.0000 mg | CHEWABLE_TABLET | Freq: Once | ORAL | Status: AC
  Administered 2021-04-06: 05:00:00 324 mg via ORAL
  Filled 2021-04-06: qty 4

## 2021-04-06 MED ORDER — LABETALOL HCL 5 MG/ML IV SOLN: 5.0000 mg | INTRAVENOUS | Status: DC | PRN

## 2021-04-06 MED ORDER — ACETAMINOPHEN 325 MG PO TABS: 650.0000 mg | ORAL_TABLET | Freq: Four times a day (QID) | ORAL | Status: DC | PRN

## 2021-04-06 MED ORDER — ONDANSETRON HCL 4 MG/2ML IJ SOLN: 4.0000 mg | Freq: Four times a day (QID) | INTRAMUSCULAR | Status: DC | PRN

## 2021-04-06 MED ORDER — ASPIRIN EC 81 MG PO TBEC
81.0000 mg | DELAYED_RELEASE_TABLET | Freq: Every day | ORAL | Status: DC
  Administered 2021-04-07: 81 mg via ORAL
  Filled 2021-04-06: qty 1

## 2021-04-06 MED ORDER — ATORVASTATIN CALCIUM 80 MG PO TABS
80.0000 mg | ORAL_TABLET | Freq: Every day | ORAL | Status: DC
  Administered 2021-04-06 – 2021-04-07 (×2): 80 mg via ORAL
  Filled 2021-04-06 (×2): qty 1

## 2021-04-06 MED ORDER — IOHEXOL 350 MG/ML SOLN
50.0000 mL | Freq: Once | INTRAVENOUS | Status: AC | PRN
  Administered 2021-04-06: 50 mL via INTRAVENOUS

## 2021-04-06 NOTE — Progress Notes (Signed)
  Echocardiogram 2D Echocardiogram has been performed.  Gerda Diss 04/06/2021, 4:07 PM

## 2021-04-06 NOTE — ED Provider Notes (Signed)
3:25 AM Assumed care from Dr. Rubin Payor (via ED to ED transfer, no report received), please see their note for full history, physical and decision making until this point. In brief this is a 25 y.o. year old male who presented to the ED tonight with Hand Problem  Patient states that this morning he woke up at 6:00 he noticed that he could not use his right hand appropriately.  States that it is hard for him to do thumb to finger.  States that he has trouble holding things with only his right hand. No other symptoms. No paresthesias.      Found to have multiple small areas of acute ischemia c/w likely embolic source. Neuro to consult. Only wants [facility]. Medicine to admit.   CRITICAL CARE Performed by: Marily Memos Total critical care time: 35 minutes Critical care time was exclusive of separately billable procedures and treating other patients. Critical care was necessary to treat or prevent imminent or life-threatening deterioration. Critical care was time spent personally by me on the following activities: development of treatment plan with patient and/or surrogate as well as nursing, discussions with consultants, evaluation of patient's response to treatment, examination of patient, obtaining history from patient or surrogate, ordering and performing treatments and interventions, ordering and review of laboratory studies, ordering and review of radiographic studies, pulse oximetry and re-evaluation of patient's condition.   Labs, studies and imaging reviewed by myself and considered in medical decision making if ordered. Imaging interpreted by radiology.  Labs Reviewed  CBC WITH DIFFERENTIAL/PLATELET - Abnormal; Notable for the following components:      Result Value   Lymphs Abs 4.1 (*)    All other components within normal limits  BASIC METABOLIC PANEL    CT Head Wo Contrast  Final Result    MR Brain W and Wo Contrast    (Results Pending)  MR Cervical Spine W or Wo Contrast    (Results  Pending)    No follow-ups on file.    Favor Hackler, Barbara Cower, MD 04/06/21 [phone]

## 2021-04-06 NOTE — Progress Notes (Signed)
    [facility] has been requested to perform a transesophageal echocardiogram on [name] for cryptogenic stroke.  After careful review of history and examination, the risks and benefits of transesophageal echocardiogram have been explained including risks of esophageal damage, perforation (1:10,000 risk), bleeding, pharyngeal hematoma as well as other potential complications associated with conscious sedation including aspiration, arrhythmia, respiratory failure and death. Alternatives to treatment were discussed, questions were answered. Patient is willing to proceed.   Tereso Newcomer, PA-C  04/06/2021 11:54 AM

## 2021-04-06 NOTE — Progress Notes (Signed)
PROGRESS NOTE    Carlos Jacobs  CVE:[id number] DOB: June 17, 1996 DOA: 04/05/2021 PCP: Shelva Majestic, MD   Brief Narrative: 25 year old Caucasian male with a past medical history significant for ADHD, tetralogy of Fallot status post repair, tobacco dependence.  The patient was admitted for right hand weakness with fine motor deficit.  Was found to have a left MCA distribution infarct.   Subjective: No acute events reported overnight.  Family present at bedside.  Right hand fine motor deficit improving approximately 50% since presentation.  States he no longer smokes cigarettes but does now vape.   Assessment & Plan:   Active Problems:   CVA (cerebral vascular accident) (HCC)   Right hand fine motor deficit/weakness secondary to acute CVA -Neurology following in consultation -Dual antiplatelet therapy with aspirin 81 mg daily and Plavix 75 mg daily.  Received Plavix 300 mg load on initial presentation -CT angio of the head and neck negative -PT/OT -LDL 147.  Continue high intensity statin -Undergoing cardioembolic work-up for left MCA distribution stroke. -TEE planned for tomorrow -No hemoglobin A1c noted will reorder morning labs  Dyslipidemia -cont high intensity statin  Former smoker, now vapes -Counselled on vaping cessation  Obesity -BMI 39, counseled on lifestyle modifications  DVT prophylaxis: SCDs Code Status: Full code Family Communication: Family was present upon my evaluation at bedside Disposition:   Status is: Observation  The patient remains OBS appropriate and will d/c before 2 midnights.  Dispo:  Patient From: Home  Planned Disposition: Home  Medically stable for discharge: No       Consultants:  Neurology and cardiology     Objective: Vitals:   04/06/21 0628 04/06/21 0813 04/06/21 1218 04/06/21 1600  BP: 121/65 116/67 122/61 133/68  Pulse: 74 67 67 (!) 55  Resp: 16 17 18 18   Temp: 98 F (36.7 C) 98.3 F (36.8 C) 98.7 F  (37.1 C) 97.8 F (36.6 C)  TempSrc: Oral Oral Oral Oral  SpO2: 96% 96% 96% 94%  Weight: 124.1 kg     Height: 5\' 10"  (1.778 m)      No intake or output data in the 24 hours ending 04/06/21 1707 Filed Weights   04/05/21 1851 04/06/21 0628  Weight: 117.9 kg 124.1 kg    Examination:  General exam: Appears calm and comfortable  Respiratory system: Clear to auscultation. Respiratory effort normal. Cardiovascular system: S1 & S2 heard, RRR. No JVD, murmurs, rubs, gallops or clicks. No pedal edema. Gastrointestinal system: Abdomen is nondistended, soft and nontender. No organomegaly or masses felt. Normal bowel sounds heard. Central nervous system: Alert and oriented. No focal neurological deficits.  4 out of 5 right hand grip strength Extremities: Symmetric 5 x 5 power. Skin: No rashes, lesions or ulcers Psychiatry: Judgement and insight appear normal. Mood & affect appropriate.     Data Reviewed: I have personally reviewed following labs and imaging studies  CBC: Recent Labs  Lab 04/06/21 0105  WBC 8.8  NEUTROABS 3.8  HGB 15.8  HCT 46.9  MCV 83.9  PLT 264    Basic Metabolic Panel: Recent Labs  Lab 04/06/21 0105  NA 138  K 3.9  CL 104  CO2 23  GLUCOSE 96  BUN 14  CREATININE 1.07  CALCIUM 9.3    GFR: Estimated Creatinine Clearance: 139.4 mL/min (by C-G formula based on SCr of 1.07 mg/dL).  Liver Function Tests: No results for input(s): AST, ALT, ALKPHOS, BILITOT, PROT, ALBUMIN in the last 168 hours.  CBG: No results for input(s): GLUCAP  in the last 168 hours.   Recent Results (from the past 240 hour(s))  SARS CORONAVIRUS 2 (TAT 6-24 HRS) Nasopharyngeal Nasopharyngeal Swab     Status: None   Collection Time: 04/06/21  5:48 AM   Specimen: Nasopharyngeal Swab  Result Value Ref Range Status   SARS Coronavirus 2 NEGATIVE NEGATIVE Final    Comment: (NOTE) SARS-CoV-2 target nucleic acids are NOT DETECTED.  The SARS-CoV-2 RNA is generally detectable in upper  and lower respiratory specimens during the acute phase of infection. Negative results do not preclude SARS-CoV-2 infection, do not rule out co-infections with other pathogens, and should not be used as the sole basis for treatment or other patient management decisions. Negative results must be combined with clinical observations, patient history, and epidemiological information. The expected result is Negative.  Fact Sheet for Patients: HairSlick.no  Fact Sheet for Healthcare Providers: quierodirigir.com  This test is not yet approved or cleared by the [place] FDA and  has been authorized for detection and/or diagnosis of SARS-CoV-2 by FDA under an Emergency Use Authorization (EUA). This EUA will remain  in effect (meaning this test can be used) for the duration of the COVID-19 declaration under Se ction 564(b)(1) of the Act, 21 U.S.C. section 360bbb-3(b)(1), unless the authorization is terminated or revoked sooner.  Performed at [facility], [address], [place], [place] [address]          Radiology Studies: CT ANGIO HEAD NECK W WO CM  Result Date: 04/06/2021 CLINICAL DATA:  Finger numbness intermittently EXAM: CT ANGIOGRAPHY HEAD AND NECK TECHNIQUE: Multidetector CT imaging of the head and neck was performed using the standard protocol during bolus administration of intravenous contrast. Multiplanar CT image reconstructions and MIPs were obtained to evaluate the vascular anatomy. Carotid stenosis measurements (when applicable) are obtained utilizing NASCET criteria, using the distal internal carotid diameter as the denominator. CONTRAST:  42mL OMNIPAQUE IOHEXOL 350 MG/ML SOLN COMPARISON:  Head CT and brain MRI from yesterday and earlier today FINDINGS: CTA NECK FINDINGS Aortic arch: Normal Right carotid system: Normal Left carotid system: Normal Vertebral arteries: Mild right vertebral artery dominance. The  vertebral arteries are smooth and widely patent to the dura. Skeleton: Negative Other neck: No visible inflammation or mass in the neck soft tissues. Upper chest: Negative apical lungs Review of the MIP images confirms the above findings CTA HEAD FINDINGS Anterior circulation: Vessels are smooth and widely patent. No branch occlusion, beading, or aneurysm Posterior circulation: Vessels are smooth and widely patent. No branch occlusion, beading, or aneurysm. Venous sinuses: Diffusely patent Anatomic variants: None significant Review of the MIP images confirms the above findings IMPRESSION: Negative CTA. No stenosis, visible vasculopathy, or evident embolic source. Electronically Signed   By: Marnee Spring M.D.   On: 04/06/2021 06:10   CT Head Wo Contrast  Result Date: 04/05/2021 CLINICAL DATA:  Woke with right hand weakness, difficulty testing finger to thumb EXAM: CT HEAD WITHOUT CONTRAST TECHNIQUE: Contiguous axial images were obtained from the base of the skull through the vertex without intravenous contrast. COMPARISON:  None. FINDINGS: Brain: No evidence of acute infarction, hemorrhage, hydrocephalus, extra-axial collection, visible mass lesion or mass effect. Vascular: No hyperdense vessel or unexpected calcification. Skull: No calvarial fracture or suspicious osseous lesion. No scalp swelling or hematoma. Sinuses/Orbits: Paranasal sinuses and mastoid air cells are predominantly clear. Included orbital structures are unremarkable. Other: None. IMPRESSION: No acute intracranial abnormality. If there is persisting concern for acute infarction, MRI is more sensitive and  specific for early features of ischemia. Electronically Signed   By: Price  DeHay M.D.   On: 04/05/2021 23:19   MR Brain W and Wo Contrast  Result Date: 04/06/2021 CLINICAL DATA:  Initial evaluation for acute neuro deficit, stroke suspected. EXAM: MRI HEAD WITHOUT AND WITH CONTRAST MRI CERVICAL SPINE WITHOUT AND WITH CONTRAST TECHNIQUE:  Multiplanar, multiecho pulse sequences of the brain and surrounding structures, and cervical spine, to include the craniocervical junction and cervicothoracic junction, were obtained without and with intravenous contrast. CONTRAST:  10mL GADAVIST GADOBUTROL 1 MMOL/ML IV SOLN COMPARISON:  Prior CT from 04/05/2021. FINDINGS: MRI HEAD FINDINGS Brain: Cerebral volume within normal limits. There are scattered patchy multifocal acute ischemic infarcts involving the cortical and subcortical aspect of the left frontal, parietal, and temporal lobes, left MCA distribution. Largest area of infarction seen at the left frontal cortex and measures up to 1.9 cm. No associated hemorrhage or mass effect. Mild patchy associated enhancement. Findings likely embolic in nature. Otherwise, the brain is normal in appearance. No other areas of acute or subacute infarction. Gray-white matter differentiation otherwise maintained. No other evidence for acute or chronic intracranial hemorrhage. No mass lesion or midline shift. No hydrocephalus or extra-axial fluid collection. Pituitary gland suprasellar region normal. Midline structures intact. No other abnormal enhancement. Vascular: Major intracranial vascular flow voids are maintained. Skull and upper cervical spine: Craniocervical junction within normal limits. Bone marrow signal intensity normal. No scalp soft tissue abnormality. Sinuses/Orbits: Globes and orbital soft tissues within normal limits. Scattered mucosal thickening noted within the maxillary sinuses. Paranasal sinuses are otherwise clear. No significant mastoid effusion. Inner ear structures grossly normal. Other: None. MRI CERVICAL SPINE FINDINGS Alignment: Straightening of the normal cervical lordosis. No listhesis. Vertebrae: Vertebral body height maintained without acute or chronic fracture. Bone marrow signal intensity within normal limits. Probable approximate 1 cm atypical hemangioma partially visualize within the T3  vertebral body. No other discrete or worrisome osseous lesions. No abnormal marrow edema or enhancement. Cord: Normal signal morphology. Posterior Fossa, vertebral arteries, paraspinal tissues: Unremarkable. Disc levels: No significant disc pathology seen within the cervical spine. No disc bulge or focal disc herniation. No significant canal or foraminal stenosis or evidence for neural impingement. IMPRESSION: MRI HEAD IMPRESSION: 1. Patchy multifocal acute ischemic nonhemorrhagic left MCA distribution infarcts. No associated mass effect. Findings likely embolic in nature. 2. Otherwise normal brain MRI. MRI CERVICAL SPINE IMPRESSION: Normal MRI of the cervical spine. No significant disc pathology, stenosis or neural impingement. Electronically Signed   By: Benjamin  McClintock M.D.   On: 04/06/2021 03:44   MR Cervical Spine W or Wo Contrast  Result Date: 04/06/2021 CLINICAL DATA:  Initial evaluation for acute neuro deficit, stroke suspected. EXAM: MRI HEAD WITHOUT AND WITH CONTRAST MRI CERVICAL SPINE WITHOUT AND WITH CONTRAST TECHNIQUE: Multiplanar, multiecho pulse sequences of the brain and surrounding structures, and cervical spine, to include the craniocervical junction and cervicothoracic junction, were obtained without and with intravenous contrast. CONTRAST:  10mL GADAVIST GADOBUTROL 1 MMOL/ML IV SOLN COMPARISON:  Prior CT from 04/05/2021. FINDINGS: MRI HEAD FINDINGS Brain: Cerebral volume within normal limits. There are scattered patchy multifocal acute ischemic infarcts involving the cortical and subcortical aspect of the left frontal, parietal, and temporal lobes, left MCA distribution. Largest area of infarction seen at the left frontal cortex and measures up to 1.9 cm. No associated hemorrhage or mass effect. Mild patchy associated enhancement. Findings likely embolic in nature. Otherwise, the brain is normal in appearance. No other areas of acute or   subacute infarction. Gray-white matter  differentiation otherwise maintained. No other evidence for acute or chronic intracranial hemorrhage. No mass lesion or midline shift. No hydrocephalus or extra-axial fluid collection. Pituitary gland suprasellar region normal. Midline structures intact. No other abnormal enhancement. Vascular: Major intracranial vascular flow voids are maintained. Skull and upper cervical spine: Craniocervical junction within normal limits. Bone marrow signal intensity normal. No scalp soft tissue abnormality. Sinuses/Orbits: Globes and orbital soft tissues within normal limits. Scattered mucosal thickening noted within the maxillary sinuses. Paranasal sinuses are otherwise clear. No significant mastoid effusion. Inner ear structures grossly normal. Other: None. MRI CERVICAL SPINE FINDINGS Alignment: Straightening of the normal cervical lordosis. No listhesis. Vertebrae: Vertebral body height maintained without acute or chronic fracture. Bone marrow signal intensity within normal limits. Probable approximate 1 cm atypical hemangioma partially visualize within the T3 vertebral body. No other discrete or worrisome osseous lesions. No abnormal marrow edema or enhancement. Cord: Normal signal morphology. Posterior Fossa, vertebral arteries, paraspinal tissues: Unremarkable. Disc levels: No significant disc pathology seen within the cervical spine. No disc bulge or focal disc herniation. No significant canal or foraminal stenosis or evidence for neural impingement. IMPRESSION: MRI HEAD IMPRESSION: 1. Patchy multifocal acute ischemic nonhemorrhagic left MCA distribution infarcts. No associated mass effect. Findings likely embolic in nature. 2. Otherwise normal brain MRI. MRI CERVICAL SPINE IMPRESSION: Normal MRI of the cervical spine. No significant disc pathology, stenosis or neural impingement. Electronically Signed   By: Rise Mu M.D.   On: 04/06/2021 03:44        Scheduled Meds:   stroke: mapping our early stages  of recovery book   Does not apply Once   [START ON 04/07/2021] aspirin EC  81 mg Oral Daily   atorvastatin  80 mg Oral Daily   [START ON 04/07/2021] clopidogrel  75 mg Oral Daily   Continuous Infusions:  lactated ringers 50 mL/hr at 04/06/21 1213     LOS: 0 days    Time spent: 35-minutes    Verdia Kuba, MD [facility]   To contact the attending provider between 7A-7P or the covering provider during after hours 7P-7A, please log into the web site [email or web address] and access using universal [place] password for that web site. If you do not have the password, please call the hospital operator.  04/06/2021, 5:07 PM

## 2021-04-06 NOTE — Consult Note (Signed)
Neurology Consultation Reason for Consult: Right hand weakness Referring Physician: Mesner, J  CC: And weakness  History is obtained from: Patient  HPI: Carlos Jacobs is a 25 y.o. male with history of tetralogy of Fallot repair who presents with right hand weakness that started on awakening yesterday morning.  He was in his normal state of health on 6/24 when he went to bed, then on 6/25, he noticed that his hand was not working quite right.  He denies any numbness or difficulty walking.  He denies any double vision or visual change.  Due to these problems, [facility] was performed which was negative.  Due to his symptoms, he was transferred to [facility] for MRI which demonstrates infarcts in patch distribution on the left.  He denies any palpitations, recent neck injury or neck pain, or other symptoms.   LKW: 6/24 prior to bed tpa given?: no, out of window   ROS: A 14 point ROS was performed and is negative except as noted in the HPI.   Past Medical History:  Diagnosis Date   ADHD (attention deficit hyperactivity disorder)    vyvanse 60mg    Asthma    exercise induced   Depression    anxiety/history when on 70mg  vyvanse- improved with decreased dose.    Migraines    Tetralogy of Fallot    s/p repair. [facility] saw cardiology until about age 79-told return 7 years.      Family History  Problem Relation Age of Onset   Other Mother        adopted   Alcohol abuse Other        biological parents per adopted father     Social History:  reports that he quit smoking about 3 years ago. His smoking use included cigarettes. He started smoking about 7 years ago. He smoked an average of 0.50 packs per day. He has never used smokeless tobacco. He reports current alcohol use of about 1.0 standard drink of alcohol per week. He reports that he does not use drugs.   Exam: Current vital signs: BP 121/69 (BP Location: Right Arm)   Pulse 68    Temp 98.4 F (36.9 C)   Resp 15   Ht 5\' 10"  (1.778 m)   Wt 117.9 kg   SpO2 97%   BMI 37.31 kg/m  Vital signs in last 24 hours: Temp:  [98.4 F (36.9 C)-98.5 F (36.9 C)] 98.4 F (36.9 C) (06/26 0007) Pulse Rate:  [68-84] 68 (06/26 0007) Resp:  [15-18] 15 (06/26 0007) BP: (121-138)/(69-89) 121/69 (06/26 0007) SpO2:  [96 %-98 %] 97 % (06/26 0007) Weight:  [117.9 kg] 117.9 kg (06/25 1851)   Physical Exam  Constitutional: Appears well-developed and well-nourished.  Psych: Affect appropriate to situation Eyes: No scleral injection HENT: No OP obstruction MSK: no joint deformities.  Cardiovascular: Normal rate and regular rhythm.  Respiratory: Effort normal, non-labored breathing GI: Soft.  No distension. There is no tenderness.  Skin: WDI  Neuro: Mental Status: Patient is awake, alert, oriented to person, place, month, year, and situation. Patient is able to give a clear and coherent history. No signs of aphasia or neglect Cranial Nerves: II: Visual Fields are full. Pupils are equal, round, and reactive to light.   III,IV, VI: EOMI without ptosis or diploplia.  V: Facial sensation is symmetric to temperature VII: Facial movement is symmetric.  VIII: hearing is intact to voice X: Uvula elevates symmetrically XI: Shoulder shrug  is symmetric. XII: tongue is midline without atrophy or fasciculations.  Motor: Tone is normal. Bulk is normal. 5/5 strength was present on the left, he has decreased fine motor movements of the right hand and very mild weakness in the right leg as well.  He is at least 4+/5 throughout the right side, 5/5 on the left Sensory: Sensation is symmetric to light touch and temperature in the arms and legs. Cerebellar: FNF and HKS are intact on the left, slow on the right    I have reviewed labs in epic and the results pertinent to this consultation are: BMP and CBC are unremarkable  I have reviewed the images obtained: MRI brain-patchy infarcts on  the left  Impression: 25 year old male with acute ischemic infarcts affecting the left hemisphere.  Possibilities include single embolic event that broke up, artery to artery recurrent embolization, proximal stenosis or occlusion.  He will need further investigation with vascular imaging as well as etiological work-up.  Recommendations: - HgbA1c, fasting lipid panel - CTA of the head and neck - Frequent neuro checks - Echocardiogram - Prophylactic therapy-Antiplatelet med: Aspirin -81 mg daily with Plavix 75 mg daily after 300 mg load - Risk factor modification - Telemetry monitoring - PT consult, OT consult, Speech consult - Stroke team to follow    Ritta Slot, MD [facility] [phone]  If 7pm- 7am, please page neurology on call as listed in AMION.

## 2021-04-06 NOTE — ED Notes (Signed)
Patient transported to MRI 

## 2021-04-06 NOTE — ED Notes (Signed)
Pt to MRI via stretcher.

## 2021-04-06 NOTE — Progress Notes (Addendum)
STROKE TEAM PROGRESS NOTE   INTERVAL HISTORY His dad & wife are at the bedside. He tells me that he works in [employer] and noted he could not pick up a hammer, he describes more distal than prox right arm/hand weakness. Denies andy difficulty ambulating and there is no right leg weakness on exam. Stroke work up underway for etiology of LMCA stroke  Vitals:   04/06/21 0007 04/06/21 0628 04/06/21 0813 04/06/21 1218  BP: 121/69 121/65 116/67 122/61  Pulse: 68 74 67 67  Resp: 15 16 17 18   Temp: 98.4 F (36.9 C) 98 F (36.7 C) 98.3 F (36.8 C) 98.7 F (37.1 C)  TempSrc:  Oral Oral Oral  SpO2: 97% 96% 96% 96%  Weight:  124.1 kg    Height:  5\' 10"  (1.778 m)     CBC:  Recent Labs  Lab 04/06/21 0105  WBC 8.8  NEUTROABS 3.8  HGB 15.8  HCT 46.9  MCV 83.9  PLT 264   Basic Metabolic Panel:  Recent Labs  Lab 04/06/21 0105  NA 138  K 3.9  CL 104  CO2 23  GLUCOSE 96  BUN 14  CREATININE 1.07  CALCIUM 9.3   Lipid Panel:  Recent Labs  Lab 04/06/21 0105  CHOL 213*  TRIG 141  HDL 38*  CHOLHDL 5.6  VLDL 28  LDLCALC 04/08/21*   HgbA1c: No results for input(s): HGBA1C in the last 168 hours. Urine Drug Screen: No results for input(s): LABOPIA, COCAINSCRNUR, LABBENZ, AMPHETMU, THCU, LABBARB in the last 168 hours.  Alcohol Level No results for input(s): ETH in the last 168 hours.  IMAGING past 24 hours CT ANGIO HEAD NECK W WO CM  Result Date: 04/06/2021 CLINICAL DATA:  Finger numbness intermittently EXAM: CT ANGIOGRAPHY HEAD AND NECK TECHNIQUE: Multidetector CT imaging of the head and neck was performed using the standard protocol during bolus administration of intravenous contrast. Multiplanar CT image reconstructions and MIPs were obtained to evaluate the vascular anatomy. Carotid stenosis measurements (when applicable) are obtained utilizing NASCET criteria, using the distal internal carotid diameter as the denominator. CONTRAST:  52mL OMNIPAQUE IOHEXOL 350 MG/ML SOLN COMPARISON:   Head CT and brain MRI from yesterday and earlier today FINDINGS: CTA NECK FINDINGS Aortic arch: Normal Right carotid system: Normal Left carotid system: Normal Vertebral arteries: Mild right vertebral artery dominance. The vertebral arteries are smooth and widely patent to the dura. Skeleton: Negative Other neck: No visible inflammation or mass in the neck soft tissues. Upper chest: Negative apical lungs Review of the MIP images confirms the above findings CTA HEAD FINDINGS Anterior circulation: Vessels are smooth and widely patent. No branch occlusion, beading, or aneurysm Posterior circulation: Vessels are smooth and widely patent. No branch occlusion, beading, or aneurysm. Venous sinuses: Diffusely patent Anatomic variants: None significant Review of the MIP images confirms the above findings IMPRESSION: Negative CTA. No stenosis, visible vasculopathy, or evident embolic source. Electronically Signed   By: 04/08/2021 M.D.   On: 04/06/2021 06:10   CT Head Wo Contrast  Result Date: 04/05/2021 CLINICAL DATA:  Woke with right hand weakness, difficulty testing finger to thumb EXAM: CT HEAD WITHOUT CONTRAST TECHNIQUE: Contiguous axial images were obtained from the base of the skull through the vertex without intravenous contrast. COMPARISON:  None. FINDINGS: Brain: No evidence of acute infarction, hemorrhage, hydrocephalus, extra-axial collection, visible mass lesion or mass effect. Vascular: No hyperdense vessel or unexpected calcification. Skull: No calvarial fracture or suspicious osseous lesion. No scalp swelling or hematoma. Sinuses/Orbits:  Paranasal sinuses and mastoid air cells are predominantly clear. Included orbital structures are unremarkable. Other: None. IMPRESSION: No acute intracranial abnormality. If there is persisting concern for acute infarction, MRI is more sensitive and specific for early features of ischemia. Electronically Signed   By: Kreg Shropshire M.D.   On: 04/05/2021 23:19   MR  Brain W and Wo Contrast  Result Date: 04/06/2021 CLINICAL DATA:  Initial evaluation for acute neuro deficit, stroke suspected. EXAM: MRI HEAD WITHOUT AND WITH CONTRAST MRI CERVICAL SPINE WITHOUT AND WITH CONTRAST TECHNIQUE: Multiplanar, multiecho pulse sequences of the brain and surrounding structures, and cervical spine, to include the craniocervical junction and cervicothoracic junction, were obtained without and with intravenous contrast. CONTRAST:  54mL GADAVIST GADOBUTROL 1 MMOL/ML IV SOLN COMPARISON:  Prior CT from 04/05/2021. FINDINGS: MRI HEAD FINDINGS Brain: Cerebral volume within normal limits. There are scattered patchy multifocal acute ischemic infarcts involving the cortical and subcortical aspect of the left frontal, parietal, and temporal lobes, left MCA distribution. Largest area of infarction seen at the left frontal cortex and measures up to 1.9 cm. No associated hemorrhage or mass effect. Mild patchy associated enhancement. Findings likely embolic in nature. Otherwise, the brain is normal in appearance. No other areas of acute or subacute infarction. Gray-white matter differentiation otherwise maintained. No other evidence for acute or chronic intracranial hemorrhage. No mass lesion or midline shift. No hydrocephalus or extra-axial fluid collection. Pituitary gland suprasellar region normal. Midline structures intact. No other abnormal enhancement. Vascular: Major intracranial vascular flow voids are maintained. Skull and upper cervical spine: Craniocervical junction within normal limits. Bone marrow signal intensity normal. No scalp soft tissue abnormality. Sinuses/Orbits: Globes and orbital soft tissues within normal limits. Scattered mucosal thickening noted within the maxillary sinuses. Paranasal sinuses are otherwise clear. No significant mastoid effusion. Inner ear structures grossly normal. Other: None. MRI CERVICAL SPINE FINDINGS Alignment: Straightening of the normal cervical lordosis.  No listhesis. Vertebrae: Vertebral body height maintained without acute or chronic fracture. Bone marrow signal intensity within normal limits. Probable approximate 1 cm atypical hemangioma partially visualize within the T3 vertebral body. No other discrete or worrisome osseous lesions. No abnormal marrow edema or enhancement. Cord: Normal signal morphology. Posterior Fossa, vertebral arteries, paraspinal tissues: Unremarkable. Disc levels: No significant disc pathology seen within the cervical spine. No disc bulge or focal disc herniation. No significant canal or foraminal stenosis or evidence for neural impingement. IMPRESSION: MRI HEAD IMPRESSION: 1. Patchy multifocal acute ischemic nonhemorrhagic left MCA distribution infarcts. No associated mass effect. Findings likely embolic in nature. 2. Otherwise normal brain MRI. MRI CERVICAL SPINE IMPRESSION: Normal MRI of the cervical spine. No significant disc pathology, stenosis or neural impingement. Electronically Signed   By: Rise Mu M.D.   On: 04/06/2021 03:44   MR Cervical Spine W or Wo Contrast  Result Date: 04/06/2021 CLINICAL DATA:  Initial evaluation for acute neuro deficit, stroke suspected. EXAM: MRI HEAD WITHOUT AND WITH CONTRAST MRI CERVICAL SPINE WITHOUT AND WITH CONTRAST TECHNIQUE: Multiplanar, multiecho pulse sequences of the brain and surrounding structures, and cervical spine, to include the craniocervical junction and cervicothoracic junction, were obtained without and with intravenous contrast. CONTRAST:  36mL GADAVIST GADOBUTROL 1 MMOL/ML IV SOLN COMPARISON:  Prior CT from 04/05/2021. FINDINGS: MRI HEAD FINDINGS Brain: Cerebral volume within normal limits. There are scattered patchy multifocal acute ischemic infarcts involving the cortical and subcortical aspect of the left frontal, parietal, and temporal lobes, left MCA distribution. Largest area of infarction seen at the left frontal cortex  and measures up to 1.9 cm. No  associated hemorrhage or mass effect. Mild patchy associated enhancement. Findings likely embolic in nature. Otherwise, the brain is normal in appearance. No other areas of acute or subacute infarction. Gray-white matter differentiation otherwise maintained. No other evidence for acute or chronic intracranial hemorrhage. No mass lesion or midline shift. No hydrocephalus or extra-axial fluid collection. Pituitary gland suprasellar region normal. Midline structures intact. No other abnormal enhancement. Vascular: Major intracranial vascular flow voids are maintained. Skull and upper cervical spine: Craniocervical junction within normal limits. Bone marrow signal intensity normal. No scalp soft tissue abnormality. Sinuses/Orbits: Globes and orbital soft tissues within normal limits. Scattered mucosal thickening noted within the maxillary sinuses. Paranasal sinuses are otherwise clear. No significant mastoid effusion. Inner ear structures grossly normal. Other: None. MRI CERVICAL SPINE FINDINGS Alignment: Straightening of the normal cervical lordosis. No listhesis. Vertebrae: Vertebral body height maintained without acute or chronic fracture. Bone marrow signal intensity within normal limits. Probable approximate 1 cm atypical hemangioma partially visualize within the T3 vertebral body. No other discrete or worrisome osseous lesions. No abnormal marrow edema or enhancement. Cord: Normal signal morphology. Posterior Fossa, vertebral arteries, paraspinal tissues: Unremarkable. Disc levels: No significant disc pathology seen within the cervical spine. No disc bulge or focal disc herniation. No significant canal or foraminal stenosis or evidence for neural impingement. IMPRESSION: MRI HEAD IMPRESSION: 1. Patchy multifocal acute ischemic nonhemorrhagic left MCA distribution infarcts. No associated mass effect. Findings likely embolic in nature. 2. Otherwise normal brain MRI. MRI CERVICAL SPINE IMPRESSION: Normal MRI of the  cervical spine. No significant disc pathology, stenosis or neural impingement. Electronically Signed   By: Rise MuBenjamin  McClintock M.D.   On: 04/06/2021 03:44    PHYSICAL EXAM General: Appears well-developed; no acute distress. Psych: Affect appropriate to situation Eyes: No scleral injection HENT: No OP obstrucion Head: Normocephalic.  Cardiovascular: Normal rate and regular rhythm.  Respiratory: Effort normal and breath sounds normal to anterior ascultation GI: Soft.  No distension. There is no tenderness.  Skin: WDI    Neurological Examination Mental Status: Alert, oriented, thought content appropriate.  Speech fluent without evidence of aphasia. Able to follow 3 step commands without difficulty. Cranial Nerves: II: Visual fields grossly normal,  III,IV, VI: ptosis not present, extra-ocular motions intact bilaterally, pupils equal, round, reactive to light and accommodation V,VII: smile symmetric, facial light touch sensation normal bilaterally VIII: hearing normal bilaterally IX,X: uvula rises symmetrically XI: bilateral shoulder shrug XII: midline tongue extension Motor: Left side is 5/5, Right grip is 4/5 with prox weakness from wrist down, at least partial wrist drop noted. Right leg 5/5.  Tone and bulk:normal tone throughout; no atrophy noted Sensory: Pinprick and light touch intact throughout, bilaterally Deep Tendon Reflexes: 2+ and symmetric throughout Plantars: Right: downgoing   Left: downgoing Cerebellar: normal finger-to-nose, normal rapid alternating movements and normal heel-to-shin test Gait: normal gait and station   ASSESSMENT/PLAN Mr. Ulla GalloJonathan Poteat-Smith is a 25 y.o. 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   Stroke: Scattered throughout LMCA; cardioembolic appearing; wk up underway for etiology. CT head No acute abnormality. CTA head & neck no stenosis. MRI  scattered LMCA  strokes 2D Echo Pending TEE planned for tomorrow LDL 147 HgbA1c No results found for requested labs within last [id number] hours. VTE prophylaxis - Freely ambulating; no indication none  prior to admission, now on aspirin 81 mg daily and clopidogrel 75 mg daily  for 3 weeks, then ASA alone Therapy recommendations:  pending Disposition:  pending  BP management Home meds:  none Stable Permissive hypertension (OK if < 220/120) but gradually normalize in 5-7 days Long-term BP goal normotensive  Hyperlipidemia Home meds:  none LDL 147, goal < 70 Add Lipitor 80mg   High intensity statin  Continue statin at discharge  Other Stroke Risk Factors Congenital heart defect: Tetralogy of Fallot Former Cigarette smoker- However he now vapes nicotine; advised to stop smoking Obesity, Body mass index is 39.24 kg/m., BMI >/= 30 associated with increased stroke risk, recommend weight loss, diet and exercise as appropriate    Hospital day # 0  Desiree Metzger-Cihelka, ARNP-C, ANVP-BC Pager: [phone]   ATTENDING NOTE: I reviewed above note and agree with the assessment and plan. Pt was seen and examined.   25 year old male with history of tetralogy of Fallot status post repair, obesity admitted for right hand weakness.  CT no acute abnormality.  MRI showed scattered left MCA small infarcts.  CTA head and neck unremarkable.  EF 55 to 60%.  LDL 147, A1c pending, creatinine 1.07.  On exam, patient father and wife at bedside.  Patient neurologically intact except right hand mild weakness with finger grip and dexterity.  Etiology for patient stroke not quite clear, however concerning for cardiac source given history of tetralogy of Fallot status post repair.  2D echo did not show significant abnormality, recommend TEE for further evaluation.  We will pursue hypercoagulable work-up if he negative.  Continue aspirin 81 and Plavix 75 DAPT for 3 weeks and then aspirin alone.  On Lipitor 80, continue on  discharge.  Will follow  For detailed assessment and plan, please refer to above as I have made changes wherever appropriate.   22, MD PhD Stroke Neurology 04/06/2021 6:09 PM    To contact Stroke Continuity provider, please refer to 04/08/2021. After hours, contact General Neurology

## 2021-04-06 NOTE — H&P (View-Only) (Signed)
PROGRESS NOTE    Carlos Jacobs  CVE:[id number] DOB: June 17, 1996 DOA: 04/05/2021 PCP: Shelva Majestic, MD   Brief Narrative: 25 year old Caucasian male with a past medical history significant for ADHD, tetralogy of Fallot status post repair, tobacco dependence.  The patient was admitted for right hand weakness with fine motor deficit.  Was found to have a left MCA distribution infarct.   Subjective: No acute events reported overnight.  Family present at bedside.  Right hand fine motor deficit improving approximately 50% since presentation.  States he no longer smokes cigarettes but does now vape.   Assessment & Plan:   Active Problems:   CVA (cerebral vascular accident) (HCC)   Right hand fine motor deficit/weakness secondary to acute CVA -Neurology following in consultation -Dual antiplatelet therapy with aspirin 81 mg daily and Plavix 75 mg daily.  Received Plavix 300 mg load on initial presentation -CT angio of the head and neck negative -PT/OT -LDL 147.  Continue high intensity statin -Undergoing cardioembolic work-up for left MCA distribution stroke. -TEE planned for tomorrow -No hemoglobin A1c noted will reorder morning labs  Dyslipidemia -cont high intensity statin  Former smoker, now vapes -Counselled on vaping cessation  Obesity -BMI 39, counseled on lifestyle modifications  DVT prophylaxis: SCDs Code Status: Full code Family Communication: Family was present upon my evaluation at bedside Disposition:   Status is: Observation  The patient remains OBS appropriate and will d/c before 2 midnights.  Dispo:  Patient From: Home  Planned Disposition: Home  Medically stable for discharge: No       Consultants:  Neurology and cardiology     Objective: Vitals:   04/06/21 0628 04/06/21 0813 04/06/21 1218 04/06/21 1600  BP: 121/65 116/67 122/61 133/68  Pulse: 74 67 67 (!) 55  Resp: 16 17 18 18   Temp: 98 F (36.7 C) 98.3 F (36.8 C) 98.7 F  (37.1 C) 97.8 F (36.6 C)  TempSrc: Oral Oral Oral Oral  SpO2: 96% 96% 96% 94%  Weight: 124.1 kg     Height: 5\' 10"  (1.778 m)      No intake or output data in the 24 hours ending 04/06/21 1707 Filed Weights   04/05/21 1851 04/06/21 0628  Weight: 117.9 kg 124.1 kg    Examination:  General exam: Appears calm and comfortable  Respiratory system: Clear to auscultation. Respiratory effort normal. Cardiovascular system: S1 & S2 heard, RRR. No JVD, murmurs, rubs, gallops or clicks. No pedal edema. Gastrointestinal system: Abdomen is nondistended, soft and nontender. No organomegaly or masses felt. Normal bowel sounds heard. Central nervous system: Alert and oriented. No focal neurological deficits.  4 out of 5 right hand grip strength Extremities: Symmetric 5 x 5 power. Skin: No rashes, lesions or ulcers Psychiatry: Judgement and insight appear normal. Mood & affect appropriate.     Data Reviewed: I have personally reviewed following labs and imaging studies  CBC: Recent Labs  Lab 04/06/21 0105  WBC 8.8  NEUTROABS 3.8  HGB 15.8  HCT 46.9  MCV 83.9  PLT 264    Basic Metabolic Panel: Recent Labs  Lab 04/06/21 0105  NA 138  K 3.9  CL 104  CO2 23  GLUCOSE 96  BUN 14  CREATININE 1.07  CALCIUM 9.3    GFR: Estimated Creatinine Clearance: 139.4 mL/min (by C-G formula based on SCr of 1.07 mg/dL).  Liver Function Tests: No results for input(s): AST, ALT, ALKPHOS, BILITOT, PROT, ALBUMIN in the last 168 hours.  CBG: No results for input(s): GLUCAP  in the last 168 hours.   Recent Results (from the past 240 hour(s))  SARS CORONAVIRUS 2 (TAT 6-24 HRS) Nasopharyngeal Nasopharyngeal Swab     Status: None   Collection Time: 04/06/21  5:48 AM   Specimen: Nasopharyngeal Swab  Result Value Ref Range Status   SARS Coronavirus 2 NEGATIVE NEGATIVE Final    Comment: (NOTE) SARS-CoV-2 target nucleic acids are NOT DETECTED.  The SARS-CoV-2 RNA is generally detectable in upper  and lower respiratory specimens during the acute phase of infection. Negative results do not preclude SARS-CoV-2 infection, do not rule out co-infections with other pathogens, and should not be used as the sole basis for treatment or other patient management decisions. Negative results must be combined with clinical observations, patient history, and epidemiological information. The expected result is Negative.  Fact Sheet for Patients: HairSlick.no  Fact Sheet for Healthcare Providers: quierodirigir.com  This test is not yet approved or cleared by the [place] FDA and  has been authorized for detection and/or diagnosis of SARS-CoV-2 by FDA under an Emergency Use Authorization (EUA). This EUA will remain  in effect (meaning this test can be used) for the duration of the COVID-19 declaration under Se ction 564(b)(1) of the Act, 21 U.S.C. section 360bbb-3(b)(1), unless the authorization is terminated or revoked sooner.  Performed at [facility], [address], [place], [place] [address]          Radiology Studies: CT ANGIO HEAD NECK W WO CM  Result Date: 04/06/2021 CLINICAL DATA:  Finger numbness intermittently EXAM: CT ANGIOGRAPHY HEAD AND NECK TECHNIQUE: Multidetector CT imaging of the head and neck was performed using the standard protocol during bolus administration of intravenous contrast. Multiplanar CT image reconstructions and MIPs were obtained to evaluate the vascular anatomy. Carotid stenosis measurements (when applicable) are obtained utilizing NASCET criteria, using the distal internal carotid diameter as the denominator. CONTRAST:  42mL OMNIPAQUE IOHEXOL 350 MG/ML SOLN COMPARISON:  Head CT and brain MRI from yesterday and earlier today FINDINGS: CTA NECK FINDINGS Aortic arch: Normal Right carotid system: Normal Left carotid system: Normal Vertebral arteries: Mild right vertebral artery dominance. The  vertebral arteries are smooth and widely patent to the dura. Skeleton: Negative Other neck: No visible inflammation or mass in the neck soft tissues. Upper chest: Negative apical lungs Review of the MIP images confirms the above findings CTA HEAD FINDINGS Anterior circulation: Vessels are smooth and widely patent. No branch occlusion, beading, or aneurysm Posterior circulation: Vessels are smooth and widely patent. No branch occlusion, beading, or aneurysm. Venous sinuses: Diffusely patent Anatomic variants: None significant Review of the MIP images confirms the above findings IMPRESSION: Negative CTA. No stenosis, visible vasculopathy, or evident embolic source. Electronically Signed   By: Marnee Spring M.D.   On: 04/06/2021 06:10   CT Head Wo Contrast  Result Date: 04/05/2021 CLINICAL DATA:  Woke with right hand weakness, difficulty testing finger to thumb EXAM: CT HEAD WITHOUT CONTRAST TECHNIQUE: Contiguous axial images were obtained from the base of the skull through the vertex without intravenous contrast. COMPARISON:  None. FINDINGS: Brain: No evidence of acute infarction, hemorrhage, hydrocephalus, extra-axial collection, visible mass lesion or mass effect. Vascular: No hyperdense vessel or unexpected calcification. Skull: No calvarial fracture or suspicious osseous lesion. No scalp swelling or hematoma. Sinuses/Orbits: Paranasal sinuses and mastoid air cells are predominantly clear. Included orbital structures are unremarkable. Other: None. IMPRESSION: No acute intracranial abnormality. If there is persisting concern for acute infarction, MRI is more sensitive and  specific for early features of ischemia. Electronically Signed   By: Kreg ShropshirePrice  DeHay M.D.   On: 04/05/2021 23:19   MR Brain W and Wo Contrast  Result Date: 04/06/2021 CLINICAL DATA:  Initial evaluation for acute neuro deficit, stroke suspected. EXAM: MRI HEAD WITHOUT AND WITH CONTRAST MRI CERVICAL SPINE WITHOUT AND WITH CONTRAST TECHNIQUE:  Multiplanar, multiecho pulse sequences of the brain and surrounding structures, and cervical spine, to include the craniocervical junction and cervicothoracic junction, were obtained without and with intravenous contrast. CONTRAST:  10mL GADAVIST GADOBUTROL 1 MMOL/ML IV SOLN COMPARISON:  Prior CT from 04/05/2021. FINDINGS: MRI HEAD FINDINGS Brain: Cerebral volume within normal limits. There are scattered patchy multifocal acute ischemic infarcts involving the cortical and subcortical aspect of the left frontal, parietal, and temporal lobes, left MCA distribution. Largest area of infarction seen at the left frontal cortex and measures up to 1.9 cm. No associated hemorrhage or mass effect. Mild patchy associated enhancement. Findings likely embolic in nature. Otherwise, the brain is normal in appearance. No other areas of acute or subacute infarction. Gray-white matter differentiation otherwise maintained. No other evidence for acute or chronic intracranial hemorrhage. No mass lesion or midline shift. No hydrocephalus or extra-axial fluid collection. Pituitary gland suprasellar region normal. Midline structures intact. No other abnormal enhancement. Vascular: Major intracranial vascular flow voids are maintained. Skull and upper cervical spine: Craniocervical junction within normal limits. Bone marrow signal intensity normal. No scalp soft tissue abnormality. Sinuses/Orbits: Globes and orbital soft tissues within normal limits. Scattered mucosal thickening noted within the maxillary sinuses. Paranasal sinuses are otherwise clear. No significant mastoid effusion. Inner ear structures grossly normal. Other: None. MRI CERVICAL SPINE FINDINGS Alignment: Straightening of the normal cervical lordosis. No listhesis. Vertebrae: Vertebral body height maintained without acute or chronic fracture. Bone marrow signal intensity within normal limits. Probable approximate 1 cm atypical hemangioma partially visualize within the T3  vertebral body. No other discrete or worrisome osseous lesions. No abnormal marrow edema or enhancement. Cord: Normal signal morphology. Posterior Fossa, vertebral arteries, paraspinal tissues: Unremarkable. Disc levels: No significant disc pathology seen within the cervical spine. No disc bulge or focal disc herniation. No significant canal or foraminal stenosis or evidence for neural impingement. IMPRESSION: MRI HEAD IMPRESSION: 1. Patchy multifocal acute ischemic nonhemorrhagic left MCA distribution infarcts. No associated mass effect. Findings likely embolic in nature. 2. Otherwise normal brain MRI. MRI CERVICAL SPINE IMPRESSION: Normal MRI of the cervical spine. No significant disc pathology, stenosis or neural impingement. Electronically Signed   By: Rise MuBenjamin  McClintock M.D.   On: 04/06/2021 03:44   MR Cervical Spine W or Wo Contrast  Result Date: 04/06/2021 CLINICAL DATA:  Initial evaluation for acute neuro deficit, stroke suspected. EXAM: MRI HEAD WITHOUT AND WITH CONTRAST MRI CERVICAL SPINE WITHOUT AND WITH CONTRAST TECHNIQUE: Multiplanar, multiecho pulse sequences of the brain and surrounding structures, and cervical spine, to include the craniocervical junction and cervicothoracic junction, were obtained without and with intravenous contrast. CONTRAST:  10mL GADAVIST GADOBUTROL 1 MMOL/ML IV SOLN COMPARISON:  Prior CT from 04/05/2021. FINDINGS: MRI HEAD FINDINGS Brain: Cerebral volume within normal limits. There are scattered patchy multifocal acute ischemic infarcts involving the cortical and subcortical aspect of the left frontal, parietal, and temporal lobes, left MCA distribution. Largest area of infarction seen at the left frontal cortex and measures up to 1.9 cm. No associated hemorrhage or mass effect. Mild patchy associated enhancement. Findings likely embolic in nature. Otherwise, the brain is normal in appearance. No other areas of acute or  subacute infarction. Gray-white matter  differentiation otherwise maintained. No other evidence for acute or chronic intracranial hemorrhage. No mass lesion or midline shift. No hydrocephalus or extra-axial fluid collection. Pituitary gland suprasellar region normal. Midline structures intact. No other abnormal enhancement. Vascular: Major intracranial vascular flow voids are maintained. Skull and upper cervical spine: Craniocervical junction within normal limits. Bone marrow signal intensity normal. No scalp soft tissue abnormality. Sinuses/Orbits: Globes and orbital soft tissues within normal limits. Scattered mucosal thickening noted within the maxillary sinuses. Paranasal sinuses are otherwise clear. No significant mastoid effusion. Inner ear structures grossly normal. Other: None. MRI CERVICAL SPINE FINDINGS Alignment: Straightening of the normal cervical lordosis. No listhesis. Vertebrae: Vertebral body height maintained without acute or chronic fracture. Bone marrow signal intensity within normal limits. Probable approximate 1 cm atypical hemangioma partially visualize within the T3 vertebral body. No other discrete or worrisome osseous lesions. No abnormal marrow edema or enhancement. Cord: Normal signal morphology. Posterior Fossa, vertebral arteries, paraspinal tissues: Unremarkable. Disc levels: No significant disc pathology seen within the cervical spine. No disc bulge or focal disc herniation. No significant canal or foraminal stenosis or evidence for neural impingement. IMPRESSION: MRI HEAD IMPRESSION: 1. Patchy multifocal acute ischemic nonhemorrhagic left MCA distribution infarcts. No associated mass effect. Findings likely embolic in nature. 2. Otherwise normal brain MRI. MRI CERVICAL SPINE IMPRESSION: Normal MRI of the cervical spine. No significant disc pathology, stenosis or neural impingement. Electronically Signed   By: Rise Mu M.D.   On: 04/06/2021 03:44        Scheduled Meds:   stroke: mapping our early stages  of recovery book   Does not apply Once   [START ON 04/07/2021] aspirin EC  81 mg Oral Daily   atorvastatin  80 mg Oral Daily   [START ON 04/07/2021] clopidogrel  75 mg Oral Daily   Continuous Infusions:  lactated ringers 50 mL/hr at 04/06/21 1213     LOS: 0 days    Time spent: 35-minutes    Verdia Kuba, MD [facility]   To contact the attending provider between 7A-7P or the covering provider during after hours 7P-7A, please log into the web site [email or web address] and access using universal [place] password for that web site. If you do not have the password, please call the hospital operator.  04/06/2021, 5:07 PM

## 2021-04-06 NOTE — Evaluation (Signed)
Physical Therapy Evaluation Patient Details Name: Carlos Jacobs MRN: [id number] DOB: 1996-02-03 Today's Date: 04/06/2021   History of Present Illness  Carlos Jacobs is a 25 y.o. malewho presents with right hand weakness;  MRI which demonstrates infarcts in patch distribution on the left; with history of tetralogy of Fallot repair  Clinical Impression  Patient evaluated by Physical Therapy with no further acute PT needs identified. All education has been completed and the patient has no further questions.  See below for any follow-up Physical Therapy or equipment needs. PT is signing off. Thank you for this referral.     Follow Up Recommendations No PT follow up    Equipment Recommendations  None recommended by PT    Recommendations for Other Services       Precautions / Restrictions Precautions Precautions: None      Mobility  Bed Mobility Overal bed mobility: Independent                  Transfers Overall transfer level: Independent Equipment used: None                Ambulation/Gait Ambulation/Gait assistance: Independent Educational psychologist (Feet): 400 Feet Assistive device: None;IV Pole Gait Pattern/deviations: WFL(Within Functional Limits) Gait velocity: WNL   General Gait Details: No difficulties  Stairs            Wheelchair Mobility    Modified Rankin (Stroke Patients Only)       Balance Overall balance assessment: No apparent balance deficits (not formally assessed);Independent                                           Pertinent Vitals/Pain Pain Assessment: No/denies pain    Home Living Family/patient expects to be discharged to:: Private residence Living Arrangements: Spouse/significant other Available Help at Discharge: Family Type of Home: House Home Access: Stairs to enter Entrance Stairs-Rails: None Secretary/administrator of Steps: 3 Home Layout: One level Home Equipment:  Crutches Additional Comments: 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 [facility]    Prior Function Level of Independence: Independent         Comments: Pt drives, works in [employer], 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 Dominance   Dominant Hand: Right    Extremity/Trunk Assessment   Upper Extremity Assessment Upper Extremity Assessment: Defer to OT evaluation    Lower Extremity Assessment Lower Extremity Assessment: Overall WFL for tasks assessed    Cervical / Trunk Assessment Cervical / Trunk Assessment: Normal  Communication   Communication: No difficulties  Cognition Arousal/Alertness: Awake/alert Behavior During Therapy: WFL for tasks assessed/performed Overall Cognitive Status: Within Functional Limits for tasks assessed                                        General Comments General comments (skin integrity, edema, etc.): Discussed signs and symptoms of stroke, BE FAST, and modifiable and non-modifiable risk factors    Exercises     Assessment/Plan    PT Assessment Patent does not need any further PT services  PT Problem List         PT Treatment Interventions      PT Goals (Current goals can be found in the Care Plan section)  Acute Rehab PT Goals Patient Stated Goal: to  get some sleep PT Goal Formulation: All assessment and education complete, DC therapy    Frequency     Barriers to discharge        Co-evaluation               AM-PAC PT "6 Clicks" Mobility  Outcome Measure Help needed turning from your back to your side while in a flat bed without using bedrails?: None Help needed moving from lying on your back to sitting on the side of a flat bed without using bedrails?: None Help needed moving to and from a bed to a chair (including a wheelchair)?: None Help needed standing up from a chair using your arms (e.g., wheelchair or bedside chair)?: None Help needed to walk in hospital room?: None Help needed climbing 3-5 steps with  a railing? : None 6 Click Score: 24    End of Session   Activity Tolerance: Patient tolerated treatment well Patient left: Other (comment) (managing independently in room) Nurse Communication: Mobility status PT Visit Diagnosis: Other symptoms and signs involving the nervous system (I95.188)    Time: 4166-0630 PT Time Calculation (min) (ACUTE ONLY): 12 min   Charges:   PT Evaluation $PT Eval Low Complexity: 1 Low          Van Clines, PT  Acute Rehabilitation Services Pager [phone] Office [phone]   [name] 04/06/2021, 6:02 PM

## 2021-04-06 NOTE — H&P (Addendum)
History and Physical  Carlos Jacobs ZOX:[id number] DOB: Feb 06, 1996 DOA: 04/05/2021  Referring physician: Dr. Clayborne DanaMesner, EDP. PCP: Shelva MajesticHunter, Stephen O, MD  Outpatient Specialists: Ophthalmology Patient coming from: Home.    Chief Complaint: Right hand with fine motor deficit.  HPI: Carlos Jacobs is a 25 y.o. male with medical history significant for ADHD, tetralogy of Fallot status postrepair, [facility] from home where he lives with his wife due to right hand fine motor deficits.  Woke-up the morning of 04/04/2021 around 6 AM with inability to fully coordinate his right hand fingers.  He presented a day later on 04/05/21 to [facility] for evaluation of his new deficit.  He denies numbness or any other motor or sensory deficits.  Denies any palpitations.  Has occasional headaches which are not new.  Reports he quit tobacco use 3 years ago and started vaping after quiting tobacco use.  He was vaping daily up until 2 weeks ago when he quit.  Admits to snoring, per his wife very loud snores.  No prior evaluation for OSA.  Patient is adopted and does not know his biological parents or their medical history.  Due to his persistent deficit and concern for stroke, the patient was transferred to [facility] ED for brain [facility].  Work-up in the ED revealed patchy multifocal acute ischemic nonhemorrhagic left MCA distribution infarcts.  No associated mass-effect.  Findings likely embolic in nature.  Seen by neurology/stroke team, Dr. Amada JupiterKirkpatrick.  [facility], hospitalist team, was asked to admit for stroke work-up.  ED Course:  Temperature 98.5.  BP 121/69, pulse 68, respiratory rate 15, O2 saturation 97% on room air. Lab studies essentially unremarkable.  Review of Systems: Review of systems as noted in the HPI. All other systems reviewed and are negative.    Past Medical History:  Diagnosis Date   ADHD (attention deficit hyperactivity disorder)    vyvanse 60mg    Asthma    exercise induced    Depression    anxiety/history when on 70mg  vyvanse- improved with decreased dose.    Migraines    Tetralogy of Fallot    s/p repair. [place] [facility]. saw cardiology until about age 22-told return 7 years.    Past Surgical History:  Procedure Laterality Date   GANGLION CYST EXCISION  2014   TETRALOGY OF FALLOT REPAIR      Social History:  reports that he quit smoking about 3 years ago. His smoking use included cigarettes. He started smoking about 7 years ago. He smoked an average of 0.50 packs per day. He has never used smokeless tobacco. He reports current alcohol use of about 1.0 standard drink of alcohol per week. He reports that he does not use drugs.   No Known Allergies  Family History  Problem Relation Age of Onset   Other Mother        adopted   Alcohol abuse Other        biological parents per adopted father      Prior to Admission medications   Medication Sig Start Date End Date Taking? Authorizing Provider  acetaminophen (TYLENOL) 500 MG tablet Take 1,000 mg by mouth every 6 (six) hours as needed for moderate pain or headache.   Yes [provider]  Melatonin 10 MG TABS Take 10 mg by mouth at bedtime as needed (sleep).   Yes [provider]    Physical Exam: BP 121/69 (BP Location: Right Arm)   Pulse 68   Temp 98.4 F (36.9 C)  Resp 15   Ht 5\' 10"  (1.778 m)   Wt 117.9 kg   SpO2 97%   BMI 37.31 kg/m   General: 25 y.o. year-old male well developed well nourished in no acute distress.  Alert and oriented x3. Cardiovascular: Regular rate and rhythm with no rubs or gallops.  No thyromegaly or JVD noted.  No lower extremity edema. 2/4 pulses in all 4 extremities. Respiratory: Clear to auscultation with no wheezes or rales. Good inspiratory effort. Abdomen: Soft nontender nondistended with normal bowel sounds x4 quadrants. Muskuloskeletal: No cyanosis, clubbing or edema noted bilaterally Neuro: CN II-XII intact, strength, sensation, reflexes. Mild to  moderate R fingers coordination deficits. Skin: No ulcerative lesions noted or rashes Psychiatry: Judgement and insight appear normal. Mood is appropriate for condition and setting          Labs on Admission:  Basic Metabolic Panel: Recent Labs  Lab 04/06/21 0105  NA 138  K 3.9  CL 104  CO2 23  GLUCOSE 96  BUN 14  CREATININE 1.07  CALCIUM 9.3   Liver Function Tests: No results for input(s): AST, ALT, ALKPHOS, BILITOT, PROT, ALBUMIN in the last 168 hours. No results for input(s): LIPASE, AMYLASE in the last 168 hours. No results for input(s): AMMONIA in the last 168 hours. CBC: Recent Labs  Lab 04/06/21 0105  WBC 8.8  NEUTROABS 3.8  HGB 15.8  HCT 46.9  MCV 83.9  PLT 264   Cardiac Enzymes: No results for input(s): CKTOTAL, CKMB, CKMBINDEX, TROPONINI in the last 168 hours.  BNP (last 3 results) No results for input(s): BNP in the last 8760 hours.  ProBNP (last 3 results) No results for input(s): PROBNP in the last 8760 hours.  CBG: No results for input(s): GLUCAP in the last 168 hours.  Radiological Exams on Admission: CT Head Wo Contrast  Result Date: 04/05/2021 CLINICAL DATA:  Woke with right hand weakness, difficulty testing finger to thumb EXAM: CT HEAD WITHOUT CONTRAST TECHNIQUE: Contiguous axial images were obtained from the base of the skull through the vertex without intravenous contrast. COMPARISON:  None. FINDINGS: Brain: No evidence of acute infarction, hemorrhage, hydrocephalus, extra-axial collection, visible mass lesion or mass effect. Vascular: No hyperdense vessel or unexpected calcification. Skull: No calvarial fracture or suspicious osseous lesion. No scalp swelling or hematoma. Sinuses/Orbits: Paranasal sinuses and mastoid air cells are predominantly clear. Included orbital structures are unremarkable. Other: None. IMPRESSION: No acute intracranial abnormality. If there is persisting concern for acute infarction, MRI is more sensitive and specific for  early features of ischemia. Electronically Signed   By: 04/07/2021 M.D.   On: 04/05/2021 23:19   MR Brain W and Wo Contrast  Result Date: 04/06/2021 CLINICAL DATA:  Initial evaluation for acute neuro deficit, stroke suspected. EXAM: MRI HEAD WITHOUT AND WITH CONTRAST MRI CERVICAL SPINE WITHOUT AND WITH CONTRAST TECHNIQUE: Multiplanar, multiecho pulse sequences of the brain and surrounding structures, and cervical spine, to include the craniocervical junction and cervicothoracic junction, were obtained without and with intravenous contrast. CONTRAST:  46mL GADAVIST GADOBUTROL 1 MMOL/ML IV SOLN COMPARISON:  Prior CT from 04/05/2021. FINDINGS: MRI HEAD FINDINGS Brain: Cerebral volume within normal limits. There are scattered patchy multifocal acute ischemic infarcts involving the cortical and subcortical aspect of the left frontal, parietal, and temporal lobes, left MCA distribution. Largest area of infarction seen at the left frontal cortex and measures up to 1.9 cm. No associated hemorrhage or mass effect. Mild patchy associated enhancement. Findings likely embolic in nature. Otherwise, the  brain is normal in appearance. No other areas of acute or subacute infarction. Gray-white matter differentiation otherwise maintained. No other evidence for acute or chronic intracranial hemorrhage. No mass lesion or midline shift. No hydrocephalus or extra-axial fluid collection. Pituitary gland suprasellar region normal. Midline structures intact. No other abnormal enhancement. Vascular: Major intracranial vascular flow voids are maintained. Skull and upper cervical spine: Craniocervical junction within normal limits. Bone marrow signal intensity normal. No scalp soft tissue abnormality. Sinuses/Orbits: Globes and orbital soft tissues within normal limits. Scattered mucosal thickening noted within the maxillary sinuses. Paranasal sinuses are otherwise clear. No significant mastoid effusion. Inner ear structures grossly  normal. Other: None. MRI CERVICAL SPINE FINDINGS Alignment: Straightening of the normal cervical lordosis. No listhesis. Vertebrae: Vertebral body height maintained without acute or chronic fracture. Bone marrow signal intensity within normal limits. Probable approximate 1 cm atypical hemangioma partially visualize within the T3 vertebral body. No other discrete or worrisome osseous lesions. No abnormal marrow edema or enhancement. Cord: Normal signal morphology. Posterior Fossa, vertebral arteries, paraspinal tissues: Unremarkable. Disc levels: No significant disc pathology seen within the cervical spine. No disc bulge or focal disc herniation. No significant canal or foraminal stenosis or evidence for neural impingement. IMPRESSION: MRI HEAD IMPRESSION: 1. Patchy multifocal acute ischemic nonhemorrhagic left MCA distribution infarcts. No associated mass effect. Findings likely embolic in nature. 2. Otherwise normal brain MRI. MRI CERVICAL SPINE IMPRESSION: Normal MRI of the cervical spine. No significant disc pathology, stenosis or neural impingement. Electronically Signed   By: Rise Mu M.D.   On: 04/06/2021 03:44   MR Cervical Spine W or Wo Contrast  Result Date: 04/06/2021 CLINICAL DATA:  Initial evaluation for acute neuro deficit, stroke suspected. EXAM: MRI HEAD WITHOUT AND WITH CONTRAST MRI CERVICAL SPINE WITHOUT AND WITH CONTRAST TECHNIQUE: Multiplanar, multiecho pulse sequences of the brain and surrounding structures, and cervical spine, to include the craniocervical junction and cervicothoracic junction, were obtained without and with intravenous contrast. CONTRAST:  25mL GADAVIST GADOBUTROL 1 MMOL/ML IV SOLN COMPARISON:  Prior CT from 04/05/2021. FINDINGS: MRI HEAD FINDINGS Brain: Cerebral volume within normal limits. There are scattered patchy multifocal acute ischemic infarcts involving the cortical and subcortical aspect of the left frontal, parietal, and temporal lobes, left MCA  distribution. Largest area of infarction seen at the left frontal cortex and measures up to 1.9 cm. No associated hemorrhage or mass effect. Mild patchy associated enhancement. Findings likely embolic in nature. Otherwise, the brain is normal in appearance. No other areas of acute or subacute infarction. Gray-white matter differentiation otherwise maintained. No other evidence for acute or chronic intracranial hemorrhage. No mass lesion or midline shift. No hydrocephalus or extra-axial fluid collection. Pituitary gland suprasellar region normal. Midline structures intact. No other abnormal enhancement. Vascular: Major intracranial vascular flow voids are maintained. Skull and upper cervical spine: Craniocervical junction within normal limits. Bone marrow signal intensity normal. No scalp soft tissue abnormality. Sinuses/Orbits: Globes and orbital soft tissues within normal limits. Scattered mucosal thickening noted within the maxillary sinuses. Paranasal sinuses are otherwise clear. No significant mastoid effusion. Inner ear structures grossly normal. Other: None. MRI CERVICAL SPINE FINDINGS Alignment: Straightening of the normal cervical lordosis. No listhesis. Vertebrae: Vertebral body height maintained without acute or chronic fracture. Bone marrow signal intensity within normal limits. Probable approximate 1 cm atypical hemangioma partially visualize within the T3 vertebral body. No other discrete or worrisome osseous lesions. No abnormal marrow edema or enhancement. Cord: Normal signal morphology. Posterior Fossa, vertebral arteries, paraspinal tissues: Unremarkable. Disc  levels: No significant disc pathology seen within the cervical spine. No disc bulge or focal disc herniation. No significant canal or foraminal stenosis or evidence for neural impingement. IMPRESSION: MRI HEAD IMPRESSION: 1. Patchy multifocal acute ischemic nonhemorrhagic left MCA distribution infarcts. No associated mass effect. Findings  likely embolic in nature. 2. Otherwise normal brain MRI. MRI CERVICAL SPINE IMPRESSION: Normal MRI of the cervical spine. No significant disc pathology, stenosis or neural impingement. Electronically Signed   By: Rise Mu M.D.   On: 04/06/2021 03:44    EKG: I independently viewed the EKG done and my findings are as followed: None available at the time of this visit.  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 Transferred to [facility] ED for MRI brain Admitted at [facility] for stroke work-up Risk factors include obesity, vaping, possible OSA, hx of tetralogy of fallot and repair Obtain fasting lipid panel, A1c Obtain 2D echo Obtain CTA head and neck, start gentle IV fluid hydration to avoid contrast induced nephropathy. Frequent Neuro-checks PT/OT/speech therapy evaluation Fall precautions/aspiration precautions Obtain twelve-lead EKG ASA 81 mg daily, Plavix 75 mg daily after 300 mg load, Lipitor 80 mg daily. Ongoing permissive HTN, treat SBP>220 or DBP>110 Appreciate neurology's assistance  History of tetralogy of fallot, s/p repair Follow 2D echo  Vaping Self reported history of daily vaping since 2018, quit 2 weeks ago. Recommend complete vaping and smoking cessation Follow UDS  Obesity BMI 37 Recommend weight loss outpatient with regular physical activity and healthy dieting  Suspected OSA Large Neck circumference, loud snore, daytime somnolence May require outpatient evaluation, defer recommendations to neurology  Alcohol use disorder Recommend cessation  No evidence of alcohol withdrawal at the time of this  visit.  History of ADHD Reports he stopped ADHD medications years ago    DVT prophylaxis: SCDs.  Code Status: Full code  Family Communication: His wife at bedside.    Disposition Plan: Admit to telemetry medical.  Consults called: Neurology/stroke team  Admission status: Observation    Status is: Observation    Dispo:  Patient From: Home  Planned Disposition: Home, possibly on 04/07/21 or when neurology signs off.  Medically stable for discharge: No      Darlin Drop MD [facility] Pager [phone]  If 7PM-7AM, please contact night-coverage [email or web address] Password [facility]  04/06/2021, 4:45 AM

## 2021-04-06 NOTE — Progress Notes (Signed)
Occupational Therapy Evaluation Patient Details Name: Carlos Jacobs MRN: [id number] DOB: 1996/07/30 Today's Date: 04/06/2021    History of Present Illness Carlos Jacobs is a 25 y.o. Bonnita Nasuti presents with right hand weakness;  MRI which demonstrates infarcts in patch distribution on the left; with history of tetralogy of Fallot repair   Clinical Impression   Carlos Jacobs was evaluated s/p the above infarct with left distribution. PTA pt is indep in all ADL/IADLs including driving and working in [employer]. He lives in a 1 level home with 3 STE with his significant other who works as an NT in the ED at [employer]. Upon evaluation pt is completing all ADLs and mobility at his indep baseline. He presents with decreased strength and coordination in his R hand and wrist, sensation in tact. Brief cognitive screens utilized throughout with no apparent concerns noted. Pt does not have further acute OT needs, OT will sign off. Recommend pt d/c home with supervision; pt may benefit from OP OT/neuro for R hand weakness concerns.      Follow Up Recommendations  No OT follow up;Supervision - Intermittent (Pt may benefit from OP OT/neuro to rehab his R hand & wrist function)    Equipment Recommendations  None recommended by OT       Precautions / Restrictions Precautions Precautions: Fall Precaution Comments: low fall Restrictions Weight Bearing Restrictions: No      Mobility Bed Mobility Overal bed mobility: Independent          Transfers Overall transfer level: Independent Equipment used: None      Balance Overall balance assessment: No apparent balance deficits (not formally assessed);Independent         ADL either performed or assessed with clinical judgement   ADL Overall ADL's : At baseline         General ADL Comments: All ADL's at independent baseline     Vision Baseline Vision/History: No visual deficits Vision Assessment?: No apparent visual deficits             Pertinent Vitals/Pain Pain Assessment: Faces Pain Score: 0-No pain Faces Pain Scale: No hurt Pain Intervention(s): Monitored during session     Hand Dominance Right   Extremity/Trunk Assessment Upper Extremity Assessment Upper Extremity Assessment: RUE deficits/detail RUE Deficits / Details: 3+/5 MMT in 4th & 5th digits of R hand; 4/5MMT in R wrist; noteable decrased gross grip strength; slow and deliberate thumb to finger translation/coordination RUE Sensation: WNL RUE Coordination: decreased fine motor;decreased gross motor   Lower Extremity Assessment Lower Extremity Assessment: Defer to PT evaluation   Cervical / Trunk Assessment Cervical / Trunk Assessment: Normal   Communication Communication Communication: No difficulties   Cognition Arousal/Alertness: Awake/alert Behavior During Therapy: WFL for tasks assessed/performed Overall Cognitive Status: Within Functional Limits for tasks assessed           General Comments: Pt able to count back from 100, by 7 with 1 error; oriented x4, has not noticed any differences in his thinking - 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   General Comments  no concerns noted            Home Living Family/patient expects to be discharged to:: Private residence Living Arrangements: Spouse/significant other Available Help at Discharge: Family Type of Home: House Home Access: Stairs to enter Secretary/administrator of Steps: 3 Entrance Stairs-Rails: None Home Layout: One level     Bathroom Shower/Tub: IT trainer: Standard     Home Equipment: Crutches   Additional  Comments: 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 [employer]      Prior Functioning/Environment Level of Independence: Independent        Comments: Pt drives, works in [employer], 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        OT Problem List: Decreased strength;Decreased range of motion;Impaired UE functional use      OT  Treatment/Interventions:      OT Goals(Current goals can be found in the care plan section) Acute Rehab OT Goals Patient Stated Goal: to get some sleep OT Goal Formulation: With patient   AM-PAC OT "6 Clicks" Daily Activity     Outcome Measure Help from another person eating meals?: None Help from another person taking care of personal grooming?: None Help from another person toileting, which includes using toliet, bedpan, or urinal?: None Help from another person bathing (including washing, rinsing, drying)?: None Help from another person to put on and taking off regular upper body clothing?: None Help from another person to put on and taking off regular lower body clothing?: None 6 Click Score: 24   End of Session Nurse Communication: Mobility status  Activity Tolerance: Patient tolerated treatment well Patient left: in bed;with family/visitor present  OT Visit Diagnosis: Hemiplegia and hemiparesis Hemiplegia - Right/Left: Right Hemiplegia - dominant/non-dominant: Dominant                Time: 8469-6295 OT Time Calculation (min): 20 min Charges:  OT General Charges $OT Visit: 1 Visit OT Evaluation $OT Eval Low Complexity: 1 Low   Carlos Jacobs 04/06/2021, 9:57 AM

## 2021-04-07 ENCOUNTER — Observation Stay (HOSPITAL_COMMUNITY): Payer: No Typology Code available for payment source | Admitting: Anesthesiology

## 2021-04-07 ENCOUNTER — Telehealth: Payer: Self-pay

## 2021-04-07 ENCOUNTER — Encounter (HOSPITAL_COMMUNITY): Payer: Self-pay | Admitting: Internal Medicine

## 2021-04-07 ENCOUNTER — Observation Stay (HOSPITAL_COMMUNITY): Payer: No Typology Code available for payment source

## 2021-04-07 ENCOUNTER — Inpatient Hospital Stay (HOSPITAL_COMMUNITY): Payer: No Typology Code available for payment source

## 2021-04-07 ENCOUNTER — Encounter (HOSPITAL_COMMUNITY)
Admission: EM | Disposition: A | Discharge status: Home / Self Care | Payer: Self-pay | Source: Home / Self Care | Attending: Family Medicine

## 2021-04-07 DIAGNOSIS — Q211 Atrial septal defect: Secondary | ICD-10-CM

## 2021-04-07 DIAGNOSIS — F17290 Nicotine dependence, other tobacco product, uncomplicated: Secondary | ICD-10-CM | POA: Diagnosis present

## 2021-04-07 DIAGNOSIS — I63412 Cerebral infarction due to embolism of left middle cerebral artery: Secondary | ICD-10-CM | POA: Diagnosis present

## 2021-04-07 DIAGNOSIS — Z8774 Personal history of (corrected) congenital malformations of heart and circulatory system: Secondary | ICD-10-CM | POA: Diagnosis not present

## 2021-04-07 DIAGNOSIS — I639 Cerebral infarction, unspecified: Secondary | ICD-10-CM

## 2021-04-07 DIAGNOSIS — R29898 Other symptoms and signs involving the musculoskeletal system: Secondary | ICD-10-CM | POA: Diagnosis not present

## 2021-04-07 DIAGNOSIS — I10 Essential (primary) hypertension: Secondary | ICD-10-CM | POA: Diagnosis present

## 2021-04-07 DIAGNOSIS — Z720 Tobacco use: Secondary | ICD-10-CM

## 2021-04-07 DIAGNOSIS — F419 Anxiety disorder, unspecified: Secondary | ICD-10-CM | POA: Diagnosis present

## 2021-04-07 DIAGNOSIS — E669 Obesity, unspecified: Secondary | ICD-10-CM | POA: Diagnosis present

## 2021-04-07 DIAGNOSIS — G43909 Migraine, unspecified, not intractable, without status migrainosus: Secondary | ICD-10-CM | POA: Diagnosis present

## 2021-04-07 DIAGNOSIS — R7303 Prediabetes: Secondary | ICD-10-CM | POA: Diagnosis present

## 2021-04-07 DIAGNOSIS — Z20822 Contact with and (suspected) exposure to covid-19: Secondary | ICD-10-CM | POA: Diagnosis present

## 2021-04-07 DIAGNOSIS — R29700 NIHSS score 0: Secondary | ICD-10-CM | POA: Diagnosis present

## 2021-04-07 DIAGNOSIS — F32A Depression, unspecified: Secondary | ICD-10-CM | POA: Diagnosis present

## 2021-04-07 DIAGNOSIS — G8191 Hemiplegia, unspecified affecting right dominant side: Secondary | ICD-10-CM | POA: Diagnosis present

## 2021-04-07 DIAGNOSIS — F1010 Alcohol abuse, uncomplicated: Secondary | ICD-10-CM | POA: Diagnosis present

## 2021-04-07 DIAGNOSIS — E7800 Pure hypercholesterolemia, unspecified: Secondary | ICD-10-CM | POA: Diagnosis not present

## 2021-04-07 DIAGNOSIS — J45909 Unspecified asthma, uncomplicated: Secondary | ICD-10-CM | POA: Diagnosis present

## 2021-04-07 DIAGNOSIS — F909 Attention-deficit hyperactivity disorder, unspecified type: Secondary | ICD-10-CM | POA: Diagnosis present

## 2021-04-07 DIAGNOSIS — E785 Hyperlipidemia, unspecified: Secondary | ICD-10-CM | POA: Diagnosis present

## 2021-04-07 DIAGNOSIS — Z8673 Personal history of transient ischemic attack (TIA), and cerebral infarction without residual deficits: Secondary | ICD-10-CM | POA: Diagnosis present

## 2021-04-07 DIAGNOSIS — Z6839 Body mass index (BMI) 39.0-39.9, adult: Secondary | ICD-10-CM | POA: Diagnosis not present

## 2021-04-07 HISTORY — PX: TEE WITHOUT CARDIOVERSION: SHX5443

## 2021-04-07 LAB — CBC
HCT: 48.8 % (ref 39.0–52.0)
Hemoglobin: 15.9 g/dL (ref 13.0–17.0)
MCH: 27.5 pg (ref 26.0–34.0)
MCHC: 32.6 g/dL (ref 30.0–36.0)
MCV: 84.4 fL (ref 80.0–100.0)
Platelets: 273 10*3/uL (ref 150–400)
RBC: 5.78 MIL/uL (ref 4.22–5.81)
RDW: 12.4 % (ref 11.5–15.5)
WBC: 8.2 10*3/uL (ref 4.0–10.5)
nRBC: 0 % (ref 0.0–0.2)

## 2021-04-07 LAB — BASIC METABOLIC PANEL
Anion gap: 10 (ref 5–15)
BUN: 16 mg/dL (ref 6–20)
CO2: 23 mmol/L (ref 22–32)
Calcium: 9.3 mg/dL (ref 8.9–10.3)
Chloride: 104 mmol/L (ref 98–111)
Creatinine, Ser: 1.08 mg/dL (ref 0.61–1.24)
GFR, Estimated: >60 mL/min (ref >60)
Glucose, Bld: 111 mg/dL — ABNORMAL HIGH (ref 70–99)
Potassium: 4 mmol/L (ref 3.5–5.1)
Sodium: 137 mmol/L (ref 135–145)

## 2021-04-07 LAB — HEMOGLOBIN A1C
Hgb A1c MFr Bld: 5.8 % — ABNORMAL HIGH (ref 4.8–5.6)
Mean Plasma Glucose: 120 mg/dL

## 2021-04-07 SURGERY — ECHOCARDIOGRAM, TRANSESOPHAGEAL
Anesthesia: Monitor Anesthesia Care

## 2021-04-07 MED ORDER — PROPOFOL 10 MG/ML IV BOLUS
INTRAVENOUS | Status: DC | PRN
  Administered 2021-04-07: 20 mg via INTRAVENOUS

## 2021-04-07 MED ORDER — LACTATED RINGERS IV SOLN: INTRAVENOUS | Status: DC

## 2021-04-07 MED ORDER — DEXMEDETOMIDINE (PRECEDEX) IN NS 20 MCG/5ML (4 MCG/ML) IV SYRINGE
PREFILLED_SYRINGE | INTRAVENOUS | Status: DC | PRN
  Administered 2021-04-07: 8 ug via INTRAVENOUS
  Administered 2021-04-07: 4 ug via INTRAVENOUS

## 2021-04-07 MED ORDER — CLOPIDOGREL BISULFATE 75 MG PO TABS: 75.0000 mg | ORAL_TABLET | Freq: Every day | ORAL | 1 refills | Status: AC

## 2021-04-07 MED ORDER — BUTAMBEN-TETRACAINE-BENZOCAINE 2-2-14 % EX AERO
INHALATION_SPRAY | CUTANEOUS | Status: DC | PRN
  Administered 2021-04-07: 2 via TOPICAL

## 2021-04-07 MED ORDER — ASPIRIN 81 MG PO TBEC: 81.0000 mg | DELAYED_RELEASE_TABLET | Freq: Every day | ORAL | 1 refills | Status: AC

## 2021-04-07 MED ORDER — SODIUM CHLORIDE 0.9 % IV SOLN: INTRAVENOUS | Status: DC

## 2021-04-07 MED ORDER — PROPOFOL 500 MG/50ML IV EMUL
INTRAVENOUS | Status: DC | PRN
  Administered 2021-04-07: 125 ug/kg/min via INTRAVENOUS

## 2021-04-07 MED ORDER — PHENYLEPHRINE 40 MCG/ML (10ML) SYRINGE FOR IV PUSH (FOR BLOOD PRESSURE SUPPORT)
PREFILLED_SYRINGE | INTRAVENOUS | Status: DC | PRN
  Administered 2021-04-07 (×2): 80 ug via INTRAVENOUS
  Administered 2021-04-07: 120 ug via INTRAVENOUS
  Administered 2021-04-07: 80 ug via INTRAVENOUS

## 2021-04-07 MED ORDER — LIDOCAINE 2% (20 MG/ML) 5 ML SYRINGE
INTRAMUSCULAR | Status: DC | PRN
  Administered 2021-04-07: 40 mg via INTRAVENOUS

## 2021-04-07 MED ORDER — ATORVASTATIN CALCIUM 80 MG PO TABS: 80.0000 mg | ORAL_TABLET | Freq: Every day | ORAL | 0 refills | Status: DC

## 2021-04-07 NOTE — Progress Notes (Signed)
Lower extremity venous bilateral study completed.   Please see CV Proc for preliminary results.   Erionna Strum, RDMS, RVT  

## 2021-04-07 NOTE — Progress Notes (Signed)
STROKE TEAM PROGRESS NOTE   INTERVAL HISTORY His dad & wife are at the bedside. Pt right hand weakness continues to improve. TEE showed PFO, no other source of emboli. Will refer to Dr. Excell Seltzerooper for PFO closure. Continue DAPT.   Vitals:   04/07/21 1215 04/07/21 1225 04/07/21 1235 04/07/21 1247  BP: 104/70 (!) 96/38 (!) 95/46 125/67  Pulse: 73 63 61 95  Resp: 16 (!) 21 17 (!) 23  Temp: 97.9 F (36.6 C)     TempSrc: Oral     SpO2: 94% 93% 93% 94%  Weight:      Height:       CBC:  Recent Labs  Lab 04/06/21 0105 04/07/21 0519  WBC 8.8 8.2  NEUTROABS 3.8  --   HGB 15.8 15.9  HCT 46.9 48.8  MCV 83.9 84.4  PLT 264 273   Basic Metabolic Panel:  Recent Labs  Lab 04/06/21 0105 04/07/21 0519  NA 138 137  K 3.9 4.0  CL 104 104  CO2 23 23  GLUCOSE 96 111*  BUN 14 16  CREATININE 1.07 1.08  CALCIUM 9.3 9.3   Lipid Panel:  Recent Labs  Lab 04/06/21 0105  CHOL 213*  TRIG 141  HDL 38*  CHOLHDL 5.6  VLDL 28  LDLCALC 161147*   HgbA1c:  Recent Labs  Lab 04/06/21 0105  HGBA1C 5.8*   Urine Drug Screen:  Recent Labs  Lab 04/06/21 0519  LABOPIA NONE DETECTED  COCAINSCRNUR NONE DETECTED  LABBENZ NONE DETECTED  AMPHETMU NONE DETECTED  THCU NONE DETECTED  LABBARB NONE DETECTED    Alcohol Level No results for input(s): ETH in the last 168 hours.  IMAGING past 24 hours ECHOCARDIOGRAM COMPLETE  Result Date: 04/06/2021    ECHOCARDIOGRAM REPORT   Patient Name:   Ulla GalloJONATHAN POTEAT-SMITH Date of Exam: 04/06/2021 Medical Rec #:  [id number]             Height:       70.0 in Accession #:    [id number]            Weight:       273.5 lb Date of Birth:  1996/04/17             BSA:          2.384 m Patient Age:    25 years              BP:           122/61 mmHg Patient Gender: M                     HR:           57 bpm. Exam Location:  Inpatient Procedure: 2D Echo, Cardiac Doppler and Color Doppler Indications:    Stroke  History:        Patient has prior history of Echocardiogram  examinations, most                 recent 05/23/2010. Risk Factors:Former Smoker. H/O Tetrology of                 Fallot. S/P repair.  Sonographer:    Ross LudwigArthur Guy RDCS (AE) Referring Phys: [id number] CAROLE N HALL IMPRESSIONS  1. Left ventricular ejection fraction, by estimation, is 55 to 60%. The left ventricle has normal function. The left ventricle has no regional wall motion abnormalities. Left ventricular diastolic parameters were normal.  2. Right ventricular systolic function is normal. The  right ventricular size is normal.  3. The mitral valve is normal in structure. Trivial mitral valve regurgitation. No evidence of mitral stenosis.  4. Cannot exclude bicuspid aortic valve. TEE pending for cryptogenic stroke and can further evalaute. The aortic valve has an indeterminant number of cusps. There is mild calcification of the aortic valve. Aortic valve regurgitation is not visualized. Mild aortic valve sclerosis is present, with no evidence of aortic valve stenosis.  5. Pulmonic valve not well seen. There is no significant pulmonic stenosis on Doppler interrogation.  6. Aortic dilatation noted. There is mild dilatation of the ascending aorta, measuring 42 mm. There is borderline dilatation of the aortic root, measuring 39 mm.  7. The inferior vena cava is normal in size with greater than 50% respiratory variability, suggesting right atrial pressure of 3 mmHg. FINDINGS  Left Ventricle: Left ventricular ejection fraction, by estimation, is 55 to 60%. The left ventricle has normal function. The left ventricle has no regional wall motion abnormalities. The left ventricular internal cavity size was normal in size. There is  no left ventricular hypertrophy. Left ventricular diastolic parameters were normal. The ratio of pulmonic flow to systemic flow (Qp/Qs ratio) is 0.60. Right Ventricle: The right ventricular size is normal. No increase in right ventricular wall thickness. Right ventricular systolic function is normal.  Left Atrium: Left atrial size was normal in size. Right Atrium: Right atrial size was normal in size. Pericardium: There is no evidence of pericardial effusion. Mitral Valve: The mitral valve is normal in structure. Trivial mitral valve regurgitation. No evidence of mitral valve stenosis. MV peak gradient, 4.4 mmHg. The mean mitral valve gradient is 1.0 mmHg. Tricuspid Valve: The tricuspid valve is normal in structure. Tricuspid valve regurgitation is trivial. No evidence of tricuspid stenosis. Aortic Valve: Cannot exclude bicuspid aortic valve. TEE pending for cryptogenic stroke and can further evalaute. The aortic valve has an indeterminant number of cusps. There is mild calcification of the aortic valve. Aortic valve regurgitation is not visualized. Mild aortic valve sclerosis is present, with no evidence of aortic valve stenosis. Aortic valve mean gradient measures 2.0 mmHg. Aortic valve peak gradient measures 4.2 mmHg. Aortic valve area, by VTI measures 3.56 cm. Pulmonic Valve: Pulmonic valve not well seen. There is no significant pulmonic stenosis on Doppler interrogation. The pulmonic valve was not well visualized. Pulmonic valve regurgitation is not visualized. No evidence of pulmonic stenosis. Aorta: Aortic dilatation noted. There is mild dilatation of the ascending aorta, measuring 42 mm. There is borderline dilatation of the aortic root, measuring 39 mm. Venous: The inferior vena cava is normal in size with greater than 50% respiratory variability, suggesting right atrial pressure of 3 mmHg. IAS/Shunts: No atrial level shunt detected by color flow Doppler. The ratio of pulmonic flow to systemic flow (Qp/Qs ratio) is 0.60.  LEFT VENTRICLE PLAX 2D LVIDd:         3.90 cm  Diastology LVIDs:         2.90 cm  LV e' medial:    12.60 cm/s LV PW:         1.20 cm  LV E/e' medial:  8.3 LV IVS:        1.00 cm  LV e' lateral:   12.40 cm/s LVOT diam:     2.30 cm  LV E/e' lateral: 8.5 LV SV:         87 LV SV Index:    37 LVOT Area:     4.15 cm  RIGHT VENTRICLE  IVC RV Basal diam:  2.70 cm     IVC diam: 0.80 cm RV S prime:     12.00 cm/s RVOT diam:      2.10 cm TAPSE (M-mode): 2.0 cm LEFT ATRIUM             Index       RIGHT ATRIUM           Index LA diam:        3.90 cm 1.64 cm/m  RA Area:     16.40 cm LA Vol (A2C):   30.7 ml 12.88 ml/m RA Volume:   43.10 ml  18.08 ml/m LA Vol (A4C):   42.2 ml 17.70 ml/m LA Biplane Vol: 37.1 ml 15.56 ml/m  AORTIC VALVE                   PULMONIC VALVE AV Area (Vmax):    3.78 cm    PV Area (Vmax):  1.53 cm AV Area (Vmean):   3.61 cm    PV Area (Vmean): 1.43 cm AV Area (VTI):     3.56 cm    PV Area (VTI):   1.60 cm AV Vmax:           102.00 cm/s PV Vmax:         1.34 m/s AV Vmean:          71.200 cm/s PV Vmean:        92.600 cm/s AV VTI:            0.245 m     PV VTI:          0.317 m AV Peak Grad:      4.2 mmHg    PV Peak grad:    7.2 mmHg AV Mean Grad:      2.0 mmHg    PV Mean grad:    4.0 mmHg LVOT Vmax:         92.80 cm/s  RVOT Peak grad:  1 mmHg LVOT Vmean:        61.800 cm/s LVOT VTI:          0.210 m LVOT/AV VTI ratio: 0.86  AORTA Ao Root diam: 3.90 cm Ao Asc diam:  4.20 cm MITRAL VALVE MV Area (PHT): 3.30 cm     SHUNTS MV Area VTI:   2.71 cm     Systemic VTI:  0.21 m MV Peak grad:  4.4 mmHg     Systemic Diam: 2.30 cm MV Mean grad:  1.0 mmHg     Pulmonic VTI:  0.146 m MV Vmax:       1.05 m/s     Pulmonic Diam: 2.10 cm MV Vmean:      51.0 cm/s    Qp/Qs:         0.58 MV Decel Time: 230 msec MV E velocity: 105.00 cm/s MV A velocity: 61.20 cm/s MV E/A ratio:  1.72 Arvilla Meres MD Electronically signed by Arvilla Meres MD Signature Date/Time: 04/06/2021/5:17:52 PM    Final    ECHO TEE  Result Date: 04/07/2021    TRANSESOPHOGEAL ECHO REPORT   Patient Name:   Carlos Jacobs Date of Exam: 04/07/2021 Medical Rec #:  [id number]             Height:       70.0 in Accession #:    [id number]            Weight:       273.5 lb Date of  Birth:  Feb 25, 1996              BSA:          2.384 m Patient Age:    25 years              BP:           104/70 mmHg Patient Gender: M                     HR:           73 bpm. Exam Location:  Inpatient Procedure: Transesophageal Echo, Cardiac Doppler and Color Doppler Indications:    Stroke  History:        Patient has prior history of Echocardiogram examinations, most                 recent 04/06/2021. Risk Factors:Former Smoker and Dyslipidemia.                 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  Sonographer:    Leta Jungling RDCS Referring Phys: [id number] Evern Bio WEAVER PROCEDURE: After discussion of the risks and benefits of a TEE, an informed consent was obtained from the patient. The patient was intubated. TEE procedure time was 30 minutes. The transesophogeal probe was passed without difficulty through the esophogus  of the patient. Sedation performed by different physician. The patient was monitored while under deep sedation. Anesthestetic sedation was provided intravenously by Anesthesiology: 346.38mg  of Propofol,  of Lidocaine. The patient developed no complications during the procedure. IMPRESSIONS  1. Mildy overriding aorta; no residual VSD; normal RV; mild PI; patent foramen ovale visualized.  2. Left ventricular ejection fraction, by estimation, is 55 to 60%. The left ventricle has normal function.  3. Right ventricular systolic function is normal. The right ventricular size is normal.  4. No left atrial/left atrial appendage thrombus was detected.  5. The mitral valve is normal in structure. Trivial mitral valve regurgitation.  6. The aortic valve is tricuspid. Aortic valve regurgitation is not visualized.  7. Aortic dilatation noted. There is mild dilatation of the aortic root, measuring 41 mm.  8. Evidence of atrial level shunting detected by color flow Doppler. FINDINGS  Left Ventricle: Left ventricular ejection fraction, by estimation, is 55 to 60%. The left  ventricle has normal function. The left ventricular internal cavity size was normal in size. Right Ventricle: The right ventricular size is normal.Right ventricular systolic function is normal. Left Atrium: Left atrial size was normal in size. No left atrial/left atrial appendage thrombus was detected. Right Atrium: Right atrial size was normal in size. Pericardium: There is no evidence of pericardial effusion. Mitral Valve: The mitral valve is normal in structure. Trivial mitral valve regurgitation. Tricuspid Valve: The tricuspid valve is normal in structure. Tricuspid valve regurgitation is trivial. Aortic Valve: The aortic valve is tricuspid. Aortic valve regurgitation is not visualized. Pulmonic Valve: The pulmonic valve was normal in structure. Pulmonic valve regurgitation is mild. Aorta: Aortic dilatation noted. There is mild dilatation of the aortic root, measuring 41 mm. There is minimal (Grade I) plaque involving the descending aorta. IAS/Shunts: Evidence of atrial level shunting detected by color flow Doppler. Additional Comments: Mildy overriding aorta; no residual VSD; normal RV; mild PI; patent foramen ovale visualized.  AORTIC VALVE LVOT Vmax:   89.70 cm/s LVOT Vmean:  57.600 cm/s LVOT VTI:  0.161 m  AORTA Ao Root diam: 3.75 cm  SHUNTS Systemic VTI: 0.16 m Olga Millers MD Electronically signed by Olga Millers MD Signature Date/Time: 04/07/2021/1:25:09 PM    Final    VAS [place] LOWER EXTREMITY VENOUS (DVT)  Result Date: 04/07/2021  Lower Venous DVT Study Patient Name:  EDRIS FRIEDT  Date of Exam:   04/07/2021 Medical Rec #: [id number]              Accession #:    [id number] Date of Birth: 06-23-1996              Patient Gender: M Patient Age:   25Y Exam Location:  [facility] Procedure:      VAS [place] LOWER EXTREMITY VENOUS (DVT) Referring Phys: [id number] Marvel Plan --------------------------------------------------------------------------------  Indications: Embolic stroke, PFO.   Comparison Study: No prior studies. Performing Technologist: Jean Rosenthal RDMS,RVT  Examination Guidelines: A complete evaluation includes B-mode imaging, spectral Doppler, color Doppler, and power Doppler as needed of all accessible portions of each vessel. Bilateral testing is considered an integral part of a complete examination. Limited examinations for reoccurring indications may be performed as noted. The reflux portion of the exam is performed with the patient in reverse Trendelenburg.  +---------+---------------+---------+-----------+----------+--------------+ RIGHT    CompressibilityPhasicitySpontaneityPropertiesThrombus Aging +---------+---------------+---------+-----------+----------+--------------+ CFV      Full           Yes      Yes                                 +---------+---------------+---------+-----------+----------+--------------+ SFJ      Full                                                        +---------+---------------+---------+-----------+----------+--------------+ FV Prox  Full                                                        +---------+---------------+---------+-----------+----------+--------------+ FV Mid   Full                                                        +---------+---------------+---------+-----------+----------+--------------+ FV DistalFull                                                        +---------+---------------+---------+-----------+----------+--------------+ PFV      Full                                                        +---------+---------------+---------+-----------+----------+--------------+ POP      Full           Yes  Yes                                 +---------+---------------+---------+-----------+----------+--------------+ PTV      Full                                                        +---------+---------------+---------+-----------+----------+--------------+ PERO      Full                                                        +---------+---------------+---------+-----------+----------+--------------+   +---------+---------------+---------+-----------+----------+--------------+ LEFT     CompressibilityPhasicitySpontaneityPropertiesThrombus Aging +---------+---------------+---------+-----------+----------+--------------+ CFV      Full           Yes      Yes                                 +---------+---------------+---------+-----------+----------+--------------+ SFJ      Full                                                        +---------+---------------+---------+-----------+----------+--------------+ FV Prox  Full                                                        +---------+---------------+---------+-----------+----------+--------------+ FV Mid   Full                                                        +---------+---------------+---------+-----------+----------+--------------+ FV DistalFull                                                        +---------+---------------+---------+-----------+----------+--------------+ PFV      Full                                                        +---------+---------------+---------+-----------+----------+--------------+ POP      Full           Yes      Yes                                 +---------+---------------+---------+-----------+----------+--------------+ PTV      Full                                                        +---------+---------------+---------+-----------+----------+--------------+  PERO     Full                                                        +---------+---------------+---------+-----------+----------+--------------+     Summary: RIGHT: - There is no evidence of deep vein thrombosis in the lower extremity.  - No cystic structure found in the popliteal fossa.  LEFT: - There is no evidence of deep vein thrombosis in the  lower extremity.  - No cystic structure found in the popliteal fossa.  *See table(s) above for measurements and observations.    Preliminary     PHYSICAL EXAM General: Appears well-developed; no acute distress. Psych: Affect appropriate to situation Eyes: No scleral injection HENT: No OP obstrucion Head: Normocephalic.  Cardiovascular: Normal rate and regular rhythm.  Respiratory: Effort normal and breath sounds normal to anterior ascultation GI: Soft.  No distension. There is no tenderness.  Skin: WDI    Neurological Examination Mental Status: Alert, oriented, thought content appropriate.  Speech fluent without evidence of aphasia. Able to follow 3 step commands without difficulty. Cranial Nerves: II: Visual fields grossly normal,  III,IV, VI: ptosis not present, extra-ocular motions intact bilaterally, pupils equal, round, reactive to light and accommodation V,VII: smile symmetric, facial light touch sensation normal bilaterally VIII: hearing normal bilaterally IX,X: uvula rises symmetrically XI: bilateral shoulder shrug XII: midline tongue extension Motor: Left side is 5/5, Right grip is 4/5 with prox weakness from wrist down, at least partial wrist drop noted. Right leg 5/5.  Tone and bulk:normal tone throughout; no atrophy noted Sensory: Pinprick and light touch intact throughout, bilaterally Deep Tendon Reflexes: 2+ and symmetric throughout Plantars: Right: downgoing   Left: downgoing Cerebellar: normal finger-to-nose, normal rapid alternating movements and normal heel-to-shin test Gait: normal gait and station   ASSESSMENT/PLAN Mr. Sherod Cisse is a 25 y.o. 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   Stroke: Scattered throughout LMCA; cardioembolic appearing likely due to PFO CT head No acute abnormality. CTA head & neck no stenosis. MRI  scattered LMCA strokes 2D Echo EF  55-60% TEE EF 55-60%. No residue VSD but PFO present LDL 147 HgbA1c pending VTE prophylaxis - Freely ambulating; no indication none  prior to admission, now on aspirin 81 mg daily and clopidogrel 75 mg daily. Continue on discharge.  Therapy recommendations: outpt PT Disposition:  home  PFO TEE showed PFO Likely the cause of stroke this time ROPE score = 9 Will refer to Dr. Excell Seltzer to consider PFO closure.  Continue DAPT until he sees Dr. Excell Seltzer  Hyperlipidemia Home meds:  none LDL 147, goal < 70 Add Lipitor 80mg   High intensity statin  Continue statin at discharge  Nicotine abuse Former smoker quit 3 years ago.  However he now vapes nicotine  Cessation education provided Pt is willing to quit  Other Stroke Risk Factors Congenital heart defect: Tetralogy of Fallot Obesity, Body mass index is 39.26 kg/m., BMI >/= 30 associated with increased stroke risk, recommend weight loss, diet and exercise as appropriate    Hospital day # 0   Neurology will sign off. Please call with questions. Pt will follow up with stroke clinic NP at [facility] in about 4 weeks. Thanks for the consult.   [facility], MD PhD Stroke  Neurology 04/07/2021 2:28 PM    To contact Stroke Continuity provider, please refer to [email or web address]. After hours, contact General Neurology

## 2021-04-07 NOTE — Transfer of Care (Signed)
Immediate Anesthesia Transfer of Care Note  Patient: Carlos Jacobs  Procedure(s) Performed: TRANSESOPHAGEAL ECHOCARDIOGRAM (TEE)  Patient Location: Endoscopy Unit  Anesthesia Type:MAC  Level of Consciousness: awake, alert  and patient cooperative  Airway & Oxygen Therapy: Patient Spontanous Breathing and Patient connected to nasal cannula oxygen  Post-op Assessment: Report given to RN and Post -op Vital signs reviewed and stable  Post vital signs: Reviewed  Last Vitals:  Vitals Value Taken Time  BP 104/70 04/07/21 1215  Temp 36.6 C 04/07/21 1215  Pulse 72 04/07/21 1217  Resp 12 04/07/21 1217  SpO2 95 % 04/07/21 1217  Vitals shown include unvalidated device data.  Last Pain:  Vitals:   04/07/21 1215  TempSrc: Oral  PainSc: 0-No pain         Complications: No notable events documented.

## 2021-04-07 NOTE — Anesthesia Procedure Notes (Signed)
Procedure Name: MAC Date/Time: 04/07/2021 11:44 AM Performed by: Janene Harvey, CRNA Pre-anesthesia Checklist: Patient identified, Emergency Drugs available, Suction available and Patient being monitored Patient Re-evaluated:Patient Re-evaluated prior to induction Oxygen Delivery Method: Nasal cannula Induction Type: IV induction Placement Confirmation: positive ETCO2 Dental Injury: Teeth and Oropharynx as per pre-operative assessment

## 2021-04-07 NOTE — Progress Notes (Signed)
    Transesophageal Echocardiogram Note  Linwood Gullikson [id number] Apr 16, 1996  Procedure: Transesophageal Echocardiogram Indications: CVA  Procedure Details Consent: Obtained Time Out: Verified patient identification, verified procedure, site/side was marked, verified correct patient position, special equipment/implants available, Radiology Safety Procedures followed,  medications/allergies/relevent history reviewed, required imaging and test results available.  Performed  Medications:  Pt sedated by anesthesia with lidocaine 40mg , precedex 12 mg and and diprovan 340 mg IV.  Normal LV function; no LAA thrombus; mildly overriding aorta; normal RV; mild PI; patent foramen ovale.  Complications: No apparent complications Patient did tolerate procedure well.  , MD

## 2021-04-07 NOTE — Telephone Encounter (Signed)
Scheduled the patient 04/16/21 for consult with Dr. Excell Seltzer. Scheduled the patient for potential PFO closure 04/18/21.  Will call the patient tomorrow to confirm.

## 2021-04-07 NOTE — Progress Notes (Signed)
RN gave patient discharge instructions and the patient stated understanding. IV has been removed and belongings have been packed Will print MD note for him

## 2021-04-07 NOTE — Discharge Summary (Signed)
Physician Discharge Summary  Carlos Jacobs ZOX:[id number] DOB: [birth year] DOA: 04/05/2021  PCP: Shelva Majestic, MD  Admit date: 04/05/2021 Discharge date: 04/07/2021  Admitted From: Home Disposition: Home   Home Health: None Equipment/Devices: None Discharge Condition: Improved CODE STATUS: Full code Diet recommendation: Regular  Hospital course: 25 year old Caucasian male with a past medical history of former cigarette smoker now vaping, obesity BMI 39, ADHD.  The patient presented to the emergency department due to right hand weakness.  CT head showed no acute abnormality.  CT angiogram of the head and neck showed no stenosis.  MRI of the brain showed scattered left MCA distribution strokes.  Concern for cardioembolic stroke was initiated.  TEE showed a EF of 55 to 60% with no residual VSD but did show PFO present.  LDL was 147.  The patient was started on high intensity statin therapy.  A1c was 5.8 which makes the patient prediabetic.  Patient was counseled on lifestyle modifications.  Neurology was consulted and followed during this hospitalization.  We will see the patient upon discharge.  Patient will have outpatient referral for PFO closure with Dr. Excell Seltzer.  He will continue dual antiplatelet therapy until he sees Dr. Excell Seltzer.  He has been counseled on smoking cessation.  For his right hand weakness he will be referred for outpatient PT.  Patient will stay out of work until he has been cleared by Dr. Excell Seltzer as he is a Corporate investment banker and concerned about further embolic strokes with heavy lifting/Valsalva maneuvers.  Discharge Diagnoses:  Active Problems:   CVA (cerebral vascular accident) [facility])   Acute CVA (cerebrovascular accident) [facility])    Discharge Instructions  Discharge Instructions     Ambulatory referral to Neurology   Complete by: As directed    Follow up with stroke clinic NP (Jessica Vanschaick or Darrol Angel, if both not available, consider Manson Allan, or Ahern) at [facility] in about 4 weeks. Thanks.   Ambulatory referral to Occupational Therapy   Complete by: As directed    Rehab his R hand & wrist function   Diet general   Complete by: As directed    Increase activity slowly   Complete by: As directed       Allergies as of 04/07/2021   No Known Allergies      Medication List     STOP taking these medications    acetaminophen 500 MG tablet Commonly known as: TYLENOL       TAKE these medications    aspirin 81 MG EC tablet Take 1 tablet (81 mg total) by mouth daily. Swallow whole. Start taking on: April 08, 2021   atorvastatin 80 MG tablet Commonly known as: LIPITOR Take 1 tablet (80 mg total) by mouth daily. Start taking on: April 08, 2021   clopidogrel 75 MG tablet Commonly known as: PLAVIX Take 1 tablet (75 mg total) by mouth daily.   Melatonin 10 MG Tabs Take 10 mg by mouth at bedtime as needed (sleep).        Follow-up Information     [facility]. Schedule an appointment as soon as possible for a visit in 1 month(s).   Specialty: Neurology Why: stroke clinic Contact information: [address] [place] [address] [phone]        Tonny Bollman, MD. Call in 1 week(s).   Specialty: Cardiology Why: Dr. Excell Seltzer office will call you soon. Contact information: [address] [place] [address] [phone]  No Known Allergies  Consultations: Neurology, cardiology for TEE   Procedures/Studies: CT ANGIO HEAD NECK W WO CM  Result Date: 04/06/2021 CLINICAL DATA:  Finger numbness intermittently EXAM: CT ANGIOGRAPHY HEAD AND NECK TECHNIQUE: Multidetector CT imaging of the head and neck was performed using the standard protocol during bolus administration of intravenous contrast. Multiplanar CT image reconstructions and MIPs were obtained to evaluate the vascular anatomy. Carotid stenosis measurements (when  applicable) are obtained utilizing NASCET criteria, using the distal internal carotid diameter as the denominator. CONTRAST:  50mL OMNIPAQUE IOHEXOL 350 MG/ML SOLN COMPARISON:  Head CT and brain MRI from yesterday and earlier today FINDINGS: CTA NECK FINDINGS Aortic arch: Normal Right carotid system: Normal Left carotid system: Normal Vertebral arteries: Mild right vertebral artery dominance. The vertebral arteries are smooth and widely patent to the dura. Skeleton: Negative Other neck: No visible inflammation or mass in the neck soft tissues. Upper chest: Negative apical lungs Review of the MIP images confirms the above findings CTA HEAD FINDINGS Anterior circulation: Vessels are smooth and widely patent. No branch occlusion, beading, or aneurysm Posterior circulation: Vessels are smooth and widely patent. No branch occlusion, beading, or aneurysm. Venous sinuses: Diffusely patent Anatomic variants: None significant Review of the MIP images confirms the above findings IMPRESSION: Negative CTA. No stenosis, visible vasculopathy, or evident embolic source. Electronically Signed   By: Marnee Spring M.D.   On: 04/06/2021 06:10   CT Head Wo Contrast  Result Date: 04/05/2021 CLINICAL DATA:  Woke with right hand weakness, difficulty testing finger to thumb EXAM: CT HEAD WITHOUT CONTRAST TECHNIQUE: Contiguous axial images were obtained from the base of the skull through the vertex without intravenous contrast. COMPARISON:  None. FINDINGS: Brain: No evidence of acute infarction, hemorrhage, hydrocephalus, extra-axial collection, visible mass lesion or mass effect. Vascular: No hyperdense vessel or unexpected calcification. Skull: No calvarial fracture or suspicious osseous lesion. No scalp swelling or hematoma. Sinuses/Orbits: Paranasal sinuses and mastoid air cells are predominantly clear. Included orbital structures are unremarkable. Other: None. IMPRESSION: No acute intracranial abnormality. If there is persisting  concern for acute infarction, MRI is more sensitive and specific for early features of ischemia. Electronically Signed   By: Kreg Shropshire M.D.   On: 04/05/2021 23:19   MR Brain W and Wo Contrast  Result Date: 04/06/2021 CLINICAL DATA:  Initial evaluation for acute neuro deficit, stroke suspected. EXAM: MRI HEAD WITHOUT AND WITH CONTRAST MRI CERVICAL SPINE WITHOUT AND WITH CONTRAST TECHNIQUE: Multiplanar, multiecho pulse sequences of the brain and surrounding structures, and cervical spine, to include the craniocervical junction and cervicothoracic junction, were obtained without and with intravenous contrast. CONTRAST:  10mL GADAVIST GADOBUTROL 1 MMOL/ML IV SOLN COMPARISON:  Prior CT from 04/05/2021. FINDINGS: MRI HEAD FINDINGS Brain: Cerebral volume within normal limits. There are scattered patchy multifocal acute ischemic infarcts involving the cortical and subcortical aspect of the left frontal, parietal, and temporal lobes, left MCA distribution. Largest area of infarction seen at the left frontal cortex and measures up to 1.9 cm. No associated hemorrhage or mass effect. Mild patchy associated enhancement. Findings likely embolic in nature. Otherwise, the brain is normal in appearance. No other areas of acute or subacute infarction. Gray-white matter differentiation otherwise maintained. No other evidence for acute or chronic intracranial hemorrhage. No mass lesion or midline shift. No hydrocephalus or extra-axial fluid collection. Pituitary gland suprasellar region normal. Midline structures intact. No other abnormal enhancement. Vascular: Major intracranial vascular flow voids are maintained. Skull and upper cervical spine: Craniocervical junction  within normal limits. Bone marrow signal intensity normal. No scalp soft tissue abnormality. Sinuses/Orbits: Globes and orbital soft tissues within normal limits. Scattered mucosal thickening noted within the maxillary sinuses. Paranasal sinuses are otherwise  clear. No significant mastoid effusion. Inner ear structures grossly normal. Other: None. MRI CERVICAL SPINE FINDINGS Alignment: Straightening of the normal cervical lordosis. No listhesis. Vertebrae: Vertebral body height maintained without acute or chronic fracture. Bone marrow signal intensity within normal limits. Probable approximate 1 cm atypical hemangioma partially visualize within the T3 vertebral body. No other discrete or worrisome osseous lesions. No abnormal marrow edema or enhancement. Cord: Normal signal morphology. Posterior Fossa, vertebral arteries, paraspinal tissues: Unremarkable. Disc levels: No significant disc pathology seen within the cervical spine. No disc bulge or focal disc herniation. No significant canal or foraminal stenosis or evidence for neural impingement. IMPRESSION: MRI HEAD IMPRESSION: 1. Patchy multifocal acute ischemic nonhemorrhagic left MCA distribution infarcts. No associated mass effect. Findings likely embolic in nature. 2. Otherwise normal brain MRI. MRI CERVICAL SPINE IMPRESSION: Normal MRI of the cervical spine. No significant disc pathology, stenosis or neural impingement. Electronically Signed   By: Rise Mu M.D.   On: 04/06/2021 03:44   MR Cervical Spine W or Wo Contrast  Result Date: 04/06/2021 CLINICAL DATA:  Initial evaluation for acute neuro deficit, stroke suspected. EXAM: MRI HEAD WITHOUT AND WITH CONTRAST MRI CERVICAL SPINE WITHOUT AND WITH CONTRAST TECHNIQUE: Multiplanar, multiecho pulse sequences of the brain and surrounding structures, and cervical spine, to include the craniocervical junction and cervicothoracic junction, were obtained without and with intravenous contrast. CONTRAST:  110mL GADAVIST GADOBUTROL 1 MMOL/ML IV SOLN COMPARISON:  Prior CT from 04/05/2021. FINDINGS: MRI HEAD FINDINGS Brain: Cerebral volume within normal limits. There are scattered patchy multifocal acute ischemic infarcts involving the cortical and subcortical  aspect of the left frontal, parietal, and temporal lobes, left MCA distribution. Largest area of infarction seen at the left frontal cortex and measures up to 1.9 cm. No associated hemorrhage or mass effect. Mild patchy associated enhancement. Findings likely embolic in nature. Otherwise, the brain is normal in appearance. No other areas of acute or subacute infarction. Gray-white matter differentiation otherwise maintained. No other evidence for acute or chronic intracranial hemorrhage. No mass lesion or midline shift. No hydrocephalus or extra-axial fluid collection. Pituitary gland suprasellar region normal. Midline structures intact. No other abnormal enhancement. Vascular: Major intracranial vascular flow voids are maintained. Skull and upper cervical spine: Craniocervical junction within normal limits. Bone marrow signal intensity normal. No scalp soft tissue abnormality. Sinuses/Orbits: Globes and orbital soft tissues within normal limits. Scattered mucosal thickening noted within the maxillary sinuses. Paranasal sinuses are otherwise clear. No significant mastoid effusion. Inner ear structures grossly normal. Other: None. MRI CERVICAL SPINE FINDINGS Alignment: Straightening of the normal cervical lordosis. No listhesis. Vertebrae: Vertebral body height maintained without acute or chronic fracture. Bone marrow signal intensity within normal limits. Probable approximate 1 cm atypical hemangioma partially visualize within the T3 vertebral body. No other discrete or worrisome osseous lesions. No abnormal marrow edema or enhancement. Cord: Normal signal morphology. Posterior Fossa, vertebral arteries, paraspinal tissues: Unremarkable. Disc levels: No significant disc pathology seen within the cervical spine. No disc bulge or focal disc herniation. No significant canal or foraminal stenosis or evidence for neural impingement. IMPRESSION: MRI HEAD IMPRESSION: 1. Patchy multifocal acute ischemic nonhemorrhagic left  MCA distribution infarcts. No associated mass effect. Findings likely embolic in nature. 2. Otherwise normal brain MRI. MRI CERVICAL SPINE IMPRESSION: Normal MRI of the cervical  spine. No significant disc pathology, stenosis or neural impingement. Electronically Signed   By: Rise Mu M.D.   On: 04/06/2021 03:44   ECHOCARDIOGRAM COMPLETE  Result Date: 04/06/2021    ECHOCARDIOGRAM REPORT   Patient Name:   Carlos Jacobs Date of Exam: 04/06/2021 Medical Rec #:  [id number]             Height:       70.0 in Accession #:    [id number]            Weight:       273.5 lb Date of Birth:  Feb 08, 1996             BSA:          2.384 m Patient Age:    25 years              BP:           122/61 mmHg Patient Gender: M                     HR:           57 bpm. Exam Location:  Inpatient Procedure: 2D Echo, Cardiac Doppler and Color Doppler Indications:    Stroke  History:        Patient has prior history of Echocardiogram examinations, most                 recent 05/23/2010. Risk Factors:Former Smoker. H/O Tetrology of                 Fallot. S/P repair.  Sonographer:    Ross Ludwig RDCS (AE) Referring Phys: [id number] CAROLE N HALL IMPRESSIONS  1. Left ventricular ejection fraction, by estimation, is 55 to 60%. The left ventricle has normal function. The left ventricle has no regional wall motion abnormalities. Left ventricular diastolic parameters were normal.  2. Right ventricular systolic function is normal. The right ventricular size is normal.  3. The mitral valve is normal in structure. Trivial mitral valve regurgitation. No evidence of mitral stenosis.  4. Cannot exclude bicuspid aortic valve. TEE pending for cryptogenic stroke and can further evalaute. The aortic valve has an indeterminant number of cusps. There is mild calcification of the aortic valve. Aortic valve regurgitation is not visualized. Mild aortic valve sclerosis is present, with no evidence of aortic valve stenosis.  5. Pulmonic valve not well  seen. There is no significant pulmonic stenosis on Doppler interrogation.  6. Aortic dilatation noted. There is mild dilatation of the ascending aorta, measuring 42 mm. There is borderline dilatation of the aortic root, measuring 39 mm.  7. The inferior vena cava is normal in size with greater than 50% respiratory variability, suggesting right atrial pressure of 3 mmHg. FINDINGS  Left Ventricle: Left ventricular ejection fraction, by estimation, is 55 to 60%. The left ventricle has normal function. The left ventricle has no regional wall motion abnormalities. The left ventricular internal cavity size was normal in size. There is  no left ventricular hypertrophy. Left ventricular diastolic parameters were normal. The ratio of pulmonic flow to systemic flow (Qp/Qs ratio) is 0.60. Right Ventricle: The right ventricular size is normal. No increase in right ventricular wall thickness. Right ventricular systolic function is normal. Left Atrium: Left atrial size was normal in size. Right Atrium: Right atrial size was normal in size. Pericardium: There is no evidence of pericardial effusion. Mitral Valve: The mitral valve is normal in structure. Trivial mitral valve regurgitation. No  evidence of mitral valve stenosis. MV peak gradient, 4.4 mmHg. The mean mitral valve gradient is 1.0 mmHg. Tricuspid Valve: The tricuspid valve is normal in structure. Tricuspid valve regurgitation is trivial. No evidence of tricuspid stenosis. Aortic Valve: Cannot exclude bicuspid aortic valve. TEE pending for cryptogenic stroke and can further evalaute. The aortic valve has an indeterminant number of cusps. There is mild calcification of the aortic valve. Aortic valve regurgitation is not visualized. Mild aortic valve sclerosis is present, with no evidence of aortic valve stenosis. Aortic valve mean gradient measures 2.0 mmHg. Aortic valve peak gradient measures 4.2 mmHg. Aortic valve area, by VTI measures 3.56 cm. Pulmonic Valve: Pulmonic  valve not well seen. There is no significant pulmonic stenosis on Doppler interrogation. The pulmonic valve was not well visualized. Pulmonic valve regurgitation is not visualized. No evidence of pulmonic stenosis. Aorta: Aortic dilatation noted. There is mild dilatation of the ascending aorta, measuring 42 mm. There is borderline dilatation of the aortic root, measuring 39 mm. Venous: The inferior vena cava is normal in size with greater than 50% respiratory variability, suggesting right atrial pressure of 3 mmHg. IAS/Shunts: No atrial level shunt detected by color flow Doppler. The ratio of pulmonic flow to systemic flow (Qp/Qs ratio) is 0.60.  LEFT VENTRICLE PLAX 2D LVIDd:         3.90 cm  Diastology LVIDs:         2.90 cm  LV e' medial:    12.60 cm/s LV PW:         1.20 cm  LV E/e' medial:  8.3 LV IVS:        1.00 cm  LV e' lateral:   12.40 cm/s LVOT diam:     2.30 cm  LV E/e' lateral: 8.5 LV SV:         87 LV SV Index:   37 LVOT Area:     4.15 cm  RIGHT VENTRICLE             IVC RV Basal diam:  2.70 cm     IVC diam: 0.80 cm RV S prime:     12.00 cm/s RVOT diam:      2.10 cm TAPSE (M-mode): 2.0 cm LEFT ATRIUM             Index       RIGHT ATRIUM           Index LA diam:        3.90 cm 1.64 cm/m  RA Area:     16.40 cm LA Vol (A2C):   30.7 ml 12.88 ml/m RA Volume:   43.10 ml  18.08 ml/m LA Vol (A4C):   42.2 ml 17.70 ml/m LA Biplane Vol: 37.1 ml 15.56 ml/m  AORTIC VALVE                   PULMONIC VALVE AV Area (Vmax):    3.78 cm    PV Area (Vmax):  1.53 cm AV Area (Vmean):   3.61 cm    PV Area (Vmean): 1.43 cm AV Area (VTI):     3.56 cm    PV Area (VTI):   1.60 cm AV Vmax:           102.00 cm/s PV Vmax:         1.34 m/s AV Vmean:          71.200 cm/s PV Vmean:        92.600 cm/s AV VTI:  0.245 m     PV VTI:          0.317 m AV Peak Grad:      4.2 mmHg    PV Peak grad:    7.2 mmHg AV Mean Grad:      2.0 mmHg    PV Mean grad:    4.0 mmHg LVOT Vmax:         92.80 cm/s  RVOT Peak grad:  1 mmHg  LVOT Vmean:        61.800 cm/s LVOT VTI:          0.210 m LVOT/AV VTI ratio: 0.86  AORTA Ao Root diam: 3.90 cm Ao Asc diam:  4.20 cm MITRAL VALVE MV Area (PHT): 3.30 cm     SHUNTS MV Area VTI:   2.71 cm     Systemic VTI:  0.21 m MV Peak grad:  4.4 mmHg     Systemic Diam: 2.30 cm MV Mean grad:  1.0 mmHg     Pulmonic VTI:  0.146 m MV Vmax:       1.05 m/s     Pulmonic Diam: 2.10 cm MV Vmean:      51.0 cm/s    Qp/Qs:         0.58 MV Decel Time: 230 msec MV E velocity: 105.00 cm/s MV A velocity: 61.20 cm/s MV E/A ratio:  1.72 Arvilla Meres MD Electronically signed by Arvilla Meres MD Signature Date/Time: 04/06/2021/5:17:52 PM    Final    ECHO TEE  Result Date: 04/07/2021    TRANSESOPHOGEAL ECHO REPORT   Patient Name:   Carlos Jacobs Date of Exam: 04/07/2021 Medical Rec #:  [id number]             Height:       70.0 in Accession #:    [id number]            Weight:       273.5 lb Date of Birth:  Feb 15, 1996             BSA:          2.384 m Patient Age:    25 years              BP:           104/70 mmHg Patient Gender: M                     HR:           73 bpm. Exam Location:  Inpatient Procedure: Transesophageal Echo, Cardiac Doppler and Color Doppler Indications:    Stroke  History:        Patient has prior history of Echocardiogram examinations, most                 recent 04/06/2021. Risk Factors:Former Smoker and Dyslipidemia.                 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  Sonographer:    Leta Jungling RDCS Referring Phys: [id number] Evern Bio WEAVER PROCEDURE: After discussion of the risks and benefits of a TEE, an informed consent was obtained from the patient. The patient was intubated. TEE procedure time was 30 minutes. The transesophogeal probe was passed without difficulty through the esophogus  of the patient. Sedation performed by different physician. The patient was monitored while under  deep sedation. Anesthestetic sedation was  provided intravenously by Anesthesiology: 346.38mg  of Propofol, 40mg  of Lidocaine. The patient developed no complications during the procedure. IMPRESSIONS  1. Mildy overriding aorta; no residual VSD; normal RV; mild PI; patent foramen ovale visualized.  2. Left ventricular ejection fraction, by estimation, is 55 to 60%. The left ventricle has normal function.  3. Right ventricular systolic function is normal. The right ventricular size is normal.  4. No left atrial/left atrial appendage thrombus was detected.  5. The mitral valve is normal in structure. Trivial mitral valve regurgitation.  6. The aortic valve is tricuspid. Aortic valve regurgitation is not visualized.  7. Aortic dilatation noted. There is mild dilatation of the aortic root, measuring 41 mm.  8. Evidence of atrial level shunting detected by color flow Doppler. FINDINGS  Left Ventricle: Left ventricular ejection fraction, by estimation, is 55 to 60%. The left ventricle has normal function. The left ventricular internal cavity size was normal in size. Right Ventricle: The right ventricular size is normal.Right ventricular systolic function is normal. Left Atrium: Left atrial size was normal in size. No left atrial/left atrial appendage thrombus was detected. Right Atrium: Right atrial size was normal in size. Pericardium: There is no evidence of pericardial effusion. Mitral Valve: The mitral valve is normal in structure. Trivial mitral valve regurgitation. Tricuspid Valve: The tricuspid valve is normal in structure. Tricuspid valve regurgitation is trivial. Aortic Valve: The aortic valve is tricuspid. Aortic valve regurgitation is not visualized. Pulmonic Valve: The pulmonic valve was normal in structure. Pulmonic valve regurgitation is mild. Aorta: Aortic dilatation noted. There is mild dilatation of the aortic root, measuring 41 mm. There is minimal (Grade I) plaque involving the descending aorta. IAS/Shunts: Evidence of atrial level shunting  detected by color flow Doppler. Additional Comments: Mildy overriding aorta; no residual VSD; normal RV; mild PI; patent foramen ovale visualized.  AORTIC VALVE LVOT Vmax:   89.70 cm/s LVOT Vmean:  57.600 cm/s LVOT VTI:    0.161 m  AORTA Ao Root diam: 3.75 cm  SHUNTS Systemic VTI: 0.16 m MD Electronically signed by Olga Millers MD Signature Date/Time: 04/07/2021/1:25:09 PM    Final    VAS 04/09/2021 LOWER EXTREMITY VENOUS (DVT)  Result Date: 04/07/2021  Lower Venous DVT Study Patient Name:  Carlos Jacobs  Date of Exam:   04/07/2021 Medical Rec #: 04/09/2021              Accession #:    [id number] Date of Birth: [birth year]              Patient Gender: M Patient Age:   75Y Exam Location:  [facility] Procedure:      VAS [facility] LOWER EXTREMITY VENOUS (DVT) Referring Phys: [id number] --------------------------------------------------------------------------------  Indications: Embolic stroke, PFO.  Comparison Study: No prior studies. Performing Technologist: Marvel Plan RDMS,RVT  Examination Guidelines: A complete evaluation includes B-mode imaging, spectral Doppler, color Doppler, and power Doppler as needed of all accessible portions of each vessel. Bilateral testing is considered an integral part of a complete examination. Limited examinations for reoccurring indications may be performed as noted. The reflux portion of the exam is performed with the patient in reverse Trendelenburg.  +---------+---------------+---------+-----------+----------+--------------+ RIGHT    CompressibilityPhasicitySpontaneityPropertiesThrombus Aging +---------+---------------+---------+-----------+----------+--------------+ CFV      Full           Yes      Yes                                 +---------+---------------+---------+-----------+----------+--------------+  SFJ      Full                                                         +---------+---------------+---------+-----------+----------+--------------+ FV Prox  Full                                                        +---------+---------------+---------+-----------+----------+--------------+ FV Mid   Full                                                        +---------+---------------+---------+-----------+----------+--------------+ FV DistalFull                                                        +---------+---------------+---------+-----------+----------+--------------+ PFV      Full                                                        +---------+---------------+---------+-----------+----------+--------------+ POP      Full           Yes      Yes                                 +---------+---------------+---------+-----------+----------+--------------+ PTV      Full                                                        +---------+---------------+---------+-----------+----------+--------------+ PERO     Full                                                        +---------+---------------+---------+-----------+----------+--------------+   +---------+---------------+---------+-----------+----------+--------------+ LEFT     CompressibilityPhasicitySpontaneityPropertiesThrombus Aging +---------+---------------+---------+-----------+----------+--------------+ CFV      Full           Yes      Yes                                 +---------+---------------+---------+-----------+----------+--------------+ SFJ      Full                                                        +---------+---------------+---------+-----------+----------+--------------+  FV Prox  Full                                                        +---------+---------------+---------+-----------+----------+--------------+ FV Mid   Full                                                         +---------+---------------+---------+-----------+----------+--------------+ FV DistalFull                                                        +---------+---------------+---------+-----------+----------+--------------+ PFV      Full                                                        +---------+---------------+---------+-----------+----------+--------------+ POP      Full           Yes      Yes                                 +---------+---------------+---------+-----------+----------+--------------+ PTV      Full                                                        +---------+---------------+---------+-----------+----------+--------------+ PERO     Full                                                        +---------+---------------+---------+-----------+----------+--------------+     Summary: RIGHT: - There is no evidence of deep vein thrombosis in the lower extremity.  - No cystic structure found in the popliteal fossa.  LEFT: - There is no evidence of deep vein thrombosis in the lower extremity.  - No cystic structure found in the popliteal fossa.  *See table(s) above for measurements and observations.    Preliminary       Discharge Exam: Vitals:   04/07/21 1235 04/07/21 1247  BP: (!) 95/46 125/67  Pulse: 61 95  Resp: 17 (!) 23  Temp:    SpO2: 93% 94%   Vitals:   04/07/21 1215 04/07/21 1225 04/07/21 1235 04/07/21 1247  BP: 104/70 (!) 96/38 (!) 95/46 125/67  Pulse: 73 63 61 95  Resp: 16 (!) 21 17 (!) 23  Temp: 97.9 F (36.6 C)     TempSrc: Oral     SpO2: 94% 93% 93% 94%  Weight:      Height:        General:  Pt is alert, awake, not in acute distress Cardiovascular: RRR, S1/S2 +, no rubs, no gallops Respiratory: CTA bilaterally, no wheezing, no rhonchi Abdominal: Soft, NT, ND, bowel sounds + Extremities: no edema, no cyanosis 4 out of 5 hand grip strength in the right hand, 5 out of 5 grip strength in the left hand.    The results of  significant diagnostics from this hospitalization (including imaging, microbiology, ancillary and laboratory) are listed below for reference.     Microbiology: Recent Results (from the past 240 hour(s))  SARS CORONAVIRUS 2 (TAT 6-24 HRS) Nasopharyngeal Nasopharyngeal Swab     Status: None   Collection Time: 04/06/21  5:48 AM   Specimen: Nasopharyngeal Swab  Result Value Ref Range Status   SARS Coronavirus 2 NEGATIVE NEGATIVE Final    Comment: (NOTE) SARS-CoV-2 target nucleic acids are NOT DETECTED.  The SARS-CoV-2 RNA is generally detectable in upper and lower respiratory specimens during the acute phase of infection. Negative results do not preclude SARS-CoV-2 infection, do not rule out co-infections with other pathogens, and should not be used as the sole basis for treatment or other patient management decisions. Negative results must be combined with clinical observations, patient history, and epidemiological information. The expected result is Negative.  Fact Sheet for Patients: HairSlick.no  Fact Sheet for Healthcare Providers: quierodirigir.com  This test is not yet approved or cleared by the [place] FDA and  has been authorized for detection and/or diagnosis of SARS-CoV-2 by FDA under an Emergency Use Authorization (EUA). This EUA will remain  in effect (meaning this test can be used) for the duration of the COVID-19 declaration under Se ction 564(b)(1) of the Act, 21 U.S.C. section 360bbb-3(b)(1), unless the authorization is terminated or revoked sooner.  Performed at [facility], [address], [place], [place] [address]      Labs: BNP (last 3 results) No results for input(s): BNP in the last 8760 hours. Basic Metabolic Panel: Recent Labs  Lab 04/06/21 0105 04/07/21 0519  NA 138 137  K 3.9 4.0  CL 104 104  CO2 23 23  GLUCOSE 96 111*  BUN 14 16  CREATININE 1.07 1.08  CALCIUM 9.3 9.3    Liver Function Tests: No results for input(s): AST, ALT, ALKPHOS, BILITOT, PROT, ALBUMIN in the last 168 hours. No results for input(s): LIPASE, AMYLASE in the last 168 hours. No results for input(s): AMMONIA in the last 168 hours. CBC: Recent Labs  Lab 04/06/21 0105 04/07/21 0519  WBC 8.8 8.2  NEUTROABS 3.8  --   HGB 15.8 15.9  HCT 46.9 48.8  MCV 83.9 84.4  PLT 264 273   Cardiac Enzymes: No results for input(s): CKTOTAL, CKMB, CKMBINDEX, TROPONINI in the last 168 hours. BNP: Invalid input(s): POCBNP CBG: No results for input(s): GLUCAP in the last 168 hours. D-Dimer No results for input(s): DDIMER in the last 72 hours. Hgb A1c Recent Labs    04/06/21 0105  HGBA1C 5.8*   Lipid Profile Recent Labs    04/06/21 0105  CHOL 213*  HDL 38*  LDLCALC 147*  TRIG 141  CHOLHDL 5.6   Thyroid function studies No results for input(s): TSH, T4TOTAL, T3FREE, THYROIDAB in the last 72 hours.  Invalid input(s): FREET3 Anemia work up No results for input(s): VITAMINB12, FOLATE, FERRITIN, TIBC, IRON, RETICCTPCT in the last 72 hours. Urinalysis    Component Value Date/Time   BILIRUBINUR Negative 05/04/2019 0907   PROTEINUR Negative 05/04/2019 0907   UROBILINOGEN 0.2 05/04/2019 5638  NITRITE Negative 05/04/2019 0907   LEUKOCYTESUR Negative 05/04/2019 0907   Sepsis Labs Invalid input(s): PROCALCITONIN,  WBC,  LACTICIDVEN Microbiology Recent Results (from the past 240 hour(s))  SARS CORONAVIRUS 2 (TAT 6-24 HRS) Nasopharyngeal Nasopharyngeal Swab     Status: None   Collection Time: 04/06/21  5:48 AM   Specimen: Nasopharyngeal Swab  Result Value Ref Range Status   SARS Coronavirus 2 NEGATIVE NEGATIVE Final    Comment: (NOTE) SARS-CoV-2 target nucleic acids are NOT DETECTED.  The SARS-CoV-2 RNA is generally detectable in upper and lower respiratory specimens during the acute phase of infection. Negative results do not preclude SARS-CoV-2 infection, do not rule  out co-infections with other pathogens, and should not be used as the sole basis for treatment or other patient management decisions. Negative results must be combined with clinical observations, patient history, and epidemiological information. The expected result is Negative.  Fact Sheet for Patients: HairSlick.no  Fact Sheet for Healthcare Providers: quierodirigir.com  This test is not yet approved or cleared by the [place] FDA and  has been authorized for detection and/or diagnosis of SARS-CoV-2 by FDA under an Emergency Use Authorization (EUA). This EUA will remain  in effect (meaning this test can be used) for the duration of the COVID-19 declaration under Se ction 564(b)(1) of the Act, 21 U.S.C. section 360bbb-3(b)(1), unless the authorization is terminated or revoked sooner.  Performed at [facility], [address], [place], [place] [address]      Time coordinating discharge: 45 minutes  SIGNED:   Verdia Kuba, MD  [facility] 04/07/2021, 2:56 PM Pager   If 7PM-7AM, please contact night-coverage [email or web address] Password [email or web address]

## 2021-04-07 NOTE — Telephone Encounter (Signed)
Per Drs. Crenshaw and Linna Darner, will arrange PFO referral with Dr. Excell Seltzer.  The patient is currently admitted.

## 2021-04-07 NOTE — Anesthesia Preprocedure Evaluation (Signed)
Anesthesia Evaluation  Patient identified by MRN, date of birth, ID band Patient awake    Reviewed: Allergy & Precautions, NPO status , Patient's Chart, lab work & pertinent test results  Airway Mallampati: II  TM Distance: >3 FB Neck ROM: Full    Dental  (+) Teeth Intact, Dental Advisory Given   Pulmonary former smoker,    breath sounds clear to auscultation       Cardiovascular  Rhythm:Regular Rate:Normal     Neuro/Psych    GI/Hepatic   Endo/Other    Renal/GU      Musculoskeletal   Abdominal (+) + obese,   Peds  Hematology   Anesthesia Other Findings   Reproductive/Obstetrics                             Anesthesia Physical Anesthesia Plan  ASA: 3  Anesthesia Plan: MAC   Post-op Pain Management:    Induction: Intravenous  PONV Risk Score and Plan: Ondansetron and Propofol infusion  Airway Management Planned: Natural Airway and Nasal Cannula  Additional Equipment:   Intra-op Plan:   Post-operative Plan:   Informed Consent: I have reviewed the patients History and Physical, chart, labs and discussed the procedure including the risks, benefits and alternatives for the proposed anesthesia with the patient or authorized representative who has indicated his/her understanding and acceptance.       Plan Discussed with: CRNA and Anesthesiologist  Anesthesia Plan Comments:         Anesthesia Quick Evaluation

## 2021-04-07 NOTE — Progress Notes (Signed)
  Echocardiogram Echocardiogram Transesophageal with color and doppler has been performed.  Carlos Jacobs 04/07/2021, 12:28 PM

## 2021-04-07 NOTE — Interval H&P Note (Signed)
History and Physical Interval Note:  04/07/2021 11:32 AM  Ulla Gallo  has presented today for surgery, with the diagnosis of stroke.  The various methods of treatment have been discussed with the patient and family. After consideration of risks, benefits and other options for treatment, the patient has consented to  Procedure(s): TRANSESOPHAGEAL ECHOCARDIOGRAM (TEE) (N/A) as a surgical intervention.  The patient's history has been reviewed, patient examined, no change in status, stable for surgery.  I have reviewed the patient's chart and labs.  Questions were answered to the patient's satisfaction.     Olga Millers

## 2021-04-08 LAB — HEMOGLOBIN A1C
Hgb A1c MFr Bld: 5.6 % (ref 4.8–5.6)
Mean Plasma Glucose: 114 mg/dL

## 2021-04-08 NOTE — Telephone Encounter (Signed)
Confirmed appointments with patient. He understands that he will get instructions for 7/8 closure at 7/6 consult if he and Dr. Excell Seltzer decide to proceed.

## 2021-04-08 NOTE — Anesthesia Postprocedure Evaluation (Signed)
Anesthesia Post Note  Patient: Carlos Jacobs  Procedure(s) Performed: TRANSESOPHAGEAL ECHOCARDIOGRAM (TEE)     Patient location during evaluation: Endoscopy Anesthesia Type: MAC Level of consciousness: awake and alert Pain management: pain level controlled Vital Signs Assessment: post-procedure vital signs reviewed and stable Respiratory status: spontaneous breathing, nonlabored ventilation, respiratory function stable and patient connected to nasal cannula oxygen Cardiovascular status: stable and blood pressure returned to baseline Postop Assessment: no apparent nausea or vomiting Anesthetic complications: no   No notable events documented.  Last Vitals:  Vitals:   04/07/21 1235 04/07/21 1247  BP: (!) 95/46 125/67  Pulse: 61 95  Resp: 17 (!) 23  Temp:    SpO2: 93% 94%    Last Pain:  Vitals:   04/07/21 1247  TempSrc:   PainSc: 0-No pain                 Selita Staiger COKER

## 2021-04-09 ENCOUNTER — Telehealth: Payer: Self-pay

## 2021-04-09 NOTE — Telephone Encounter (Signed)
Transition Care Management Follow-up Telephone Call Date of discharge and from where: [place] 04/07/21 How have you been since you were released from the hospital? Really  Any questions or concerns? No  Items Reviewed: Did the pt receive and understand the discharge instructions provided? Yes  Medications obtained and verified? Pt stated he will review with Dr Durene Cal on 04/10/21 Other? No  Any new allergies since your discharge? No  Dietary orders reviewed? Yes Do you have support at home? Yes   Home Care and Equipment/Supplies: Were home health services ordered? not applicable If so, what is the name of the agency?   Has the agency set up a time to come to the patient's home? not applicable Were any new equipment or medical supplies ordered?  No What is the name of the medical supply agency?  Were you able to get the supplies/equipment? not applicable Do you have any questions related to the use of the equipment or supplies? No  Functional Questionnaire: (I = Independent and D = Dependent) ADLs: I  Bathing/Dressing- I  Meal Prep- I  Eating- I  Maintaining continence- I  Transferring/Ambulation- I  Managing Meds- I  Follow up appointments reviewed:  PCP Hospital f/u appt confirmed? Yes  Scheduled to see Dr Durene Cal on 04/10/21 @ 2:40. Specialist Hospital f/u appt confirmed? Yes  Scheduled to see Neurology  on 04/10/21 @ 5:00. Are transportation arrangements needed? No  If their condition worsens, is the pt aware to call PCP or go to the Emergency Dept.? Yes Was the patient provided with contact information for the PCP's office or ED? Yes Was to pt encouraged to call back with questions or concerns? Yes

## 2021-04-10 ENCOUNTER — Other Ambulatory Visit: Payer: Self-pay

## 2021-04-10 ENCOUNTER — Encounter: Payer: Self-pay | Admitting: Family Medicine

## 2021-04-10 ENCOUNTER — Ambulatory Visit: Payer: No Typology Code available for payment source | Attending: Family Medicine | Admitting: Occupational Therapy

## 2021-04-10 ENCOUNTER — Ambulatory Visit (INDEPENDENT_AMBULATORY_CARE_PROVIDER_SITE_OTHER): Payer: No Typology Code available for payment source | Admitting: Family Medicine

## 2021-04-10 ENCOUNTER — Encounter: Payer: Self-pay | Admitting: Occupational Therapy

## 2021-04-10 VITALS — BP 118/78 | HR 78 | Temp 98.3°F | Ht 71.0 in | Wt 276.2 lb

## 2021-04-10 DIAGNOSIS — R278 Other lack of coordination: Secondary | ICD-10-CM | POA: Diagnosis not present

## 2021-04-10 DIAGNOSIS — E785 Hyperlipidemia, unspecified: Secondary | ICD-10-CM | POA: Diagnosis not present

## 2021-04-10 DIAGNOSIS — M6281 Muscle weakness (generalized): Secondary | ICD-10-CM | POA: Diagnosis present

## 2021-04-10 DIAGNOSIS — I69851 Hemiplegia and hemiparesis following other cerebrovascular disease affecting right dominant side: Secondary | ICD-10-CM | POA: Diagnosis present

## 2021-04-10 DIAGNOSIS — F900 Attention-deficit hyperactivity disorder, predominantly inattentive type: Secondary | ICD-10-CM | POA: Diagnosis not present

## 2021-04-10 DIAGNOSIS — R739 Hyperglycemia, unspecified: Secondary | ICD-10-CM

## 2021-04-10 DIAGNOSIS — Z8673 Personal history of transient ischemic attack (TIA), and cerebral infarction without residual deficits: Secondary | ICD-10-CM | POA: Diagnosis not present

## 2021-04-10 NOTE — Progress Notes (Signed)
Phone [phone] In person visit   Subjective:   Carlos Jacobs is a 25 y.o. year old very pleasant male patient who presents for/with See problem oriented charting  This visit occurred during the SARS-CoV-2 public health emergency.  Safety protocols were in place, including screening questions prior to the visit, additional usage of staff PPE, and extensive cleaning of exam room while observing appropriate contact time as indicated for disinfecting solutions.   Past Medical History-  Patient Active Problem List   Diagnosis Date Noted   History of cardioembolic cerebrovascular accident (CVA) 04/07/2021    Priority: High   ADHD (attention deficit hyperactivity disorder)     Priority: High   Tetralogy of Fallot     Priority: High   Hyperglycemia 04/10/2021    Priority: Medium   Hyperlipidemia 04/10/2021    Priority: Medium   Asthma     Priority: Medium   Migraines     Priority: Medium   Depression     Priority: Medium   Former smoker 05/02/2018    Priority: Low   Ganglion cyst of dorsum of left wrist 05/02/2018    Priority: Low   Acne 05/01/2015    Priority: Low    Medications- reviewed and updated Current Outpatient Medications  Medication Sig Dispense Refill   aspirin EC 81 MG EC tablet Take 1 tablet (81 mg total) by mouth daily. Swallow whole. 90 tablet 1   atorvastatin (LIPITOR) 80 MG tablet Take 1 tablet (80 mg total) by mouth daily. 90 tablet 0   clopidogrel (PLAVIX) 75 MG tablet Take 1 tablet (75 mg total) by mouth daily. 90 tablet 1   Melatonin 10 MG TABS Take 10 mg by mouth at bedtime as needed (sleep).     No current facility-administered medications for this visit.     Objective:  BP 118/78   Pulse 78   Temp 98.3 F (36.8 C) (Temporal)   Ht 5\' 11"  (1.803 m)   Wt 276 lb 3.2 oz (125.3 kg)   SpO2 96%   BMI 38.52 kg/m  Gen: NAD, resting comfortably CV: RRR no murmurs rubs or gallops Lungs: CTAB no crackles, wheeze, rhonchi Abdomen:  soft/nontender/nondistended/normal bowel sounds. No rebound or guarding.  Ext: no edema Skin: warm, dry Neuro: Right-sided weakness with grip strength particularly with pulling from the ulnar side-no decrease sensation in his hands noted    Assessment and Plan  #Hospital follow-up for Weakness of Right Hand because of acute CVA S:Patient presented to the ED on 04/05/2021 with a chief complaint of weakness in his right hand. A CT of the head showed no acute abnormality. CT angiogram of the head and neck showed no stenosis or obvious vasculopathy or obvious embolic source. MRI of the brain showed scattered left MCA distribution strokes and concern for cardio-embolic stroke . TEE showed EF of 55-60%- no residual VSD but did show PFO present. LDL was 147. He was started on high intensity statin therapy. A1c was 5.8- making him prediabetic.  -He was referred with Dr. 04/07/2021 for PFO closure- it was planned for him to continue dual antiplatelet therapy until his visit with Dr. Excell Seltzer on 04/18/2021- also planned to stay out of work until this visit. He was counseled for lifestyle modifications and smoking cessation gastrically for vaping. For his right hand weakness, a referral was placed for outpatient PT. Diagnosis was acute CVA. He was instructed to take dual antiplatelet therapy with Aspirin 81 mg and plavix 75 mg  3-4 weeks before this  started - they had stopped vaping, started getting into the gym. Had peaked weight at 295 but gotten down to 255- currently at 275. He is doing 15 minute walk right now.   Of note patient with history of tetralogy of Fallot repair and 52-month-old.  He had last been seen by Dr. Elizebeth Brooking in 2014 with no plan regular follow-up and no restricted activity.  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  Patient was referred to outpatient neurology with plans to be seen within 4 weeks as well as occupational therapy for rehab of right  hand and wrist A/P: Patient with right-sided hemiparesis (right-hand-dominant) after recent CVA related to PFO.  Has upcoming procedure with Dr. Excell Seltzer for correction of this.  They appropriately modify risk factors by attempting to reduce lipids-now on atorvastatin 80 mg to try to get LDL under 70-consider repeat direct LDL at follow-up.  Also on combination of aspirin and Plavix until has procedure-I encouraged him to continue this.  We discussed importance of following up with procedure with cardiology-also discussed that I do not suspect his long-term CVA risk is high once this is completed -Also thankfully he quit smoking in recent years and vaping more recently-should remain off of these  # ADHD S:patient presents today wanting to discuss about medications-has done well with vyvase- had been up to 60 mg with psychiatry-had actually been up to 70 mg in the past but had some anxiety with this. Has developed coping mechanisms- nicotine helped him ut now off of that feels needs more support.  He feels like he is having worsening symptoms A/P: We discussed importance of not restarting Vyvanse now-I would certainly wait until after procedure and also would want to get approval from cardiology and neurology-thankfully he sees neurology before next visit as well as cardiology  Patient is requesting a take over prescription of Vyvanse-once again I am willing to consider this but I would also like to get records from psychiatrist   # Hyperglycemia/insulin resistance/prediabetes S:  Medication: None Exercise and diet-patient had begun improving diet and exercise prior to stroke Lab Results  Component Value Date   HGBA1C 5.6 04/07/2021   HGBA1C 5.8 (H) 04/06/2021   A/P: Patient with prediabetes noted during hospital visit based on A1c of 5.8-repeat today following was not elevated-regardless we will treat as prediabetes and recommended healthy eating/regular exercise-obviously needs to be cleared for the  regular exercise after procedure with Dr. Derwood Kaplan hold off for now   Recommended follow up: Keep August visit Future Appointments  Date Time Provider Department Center  04/16/2021 11:00 AM Tonny Bollman, MD [facility]  05/13/2021  8:15 AM Ihor Austin, NP [facility] None  05/19/2021  8:40 AM Shelva Majestic, MD [facility]   Lab/Order associations:   ICD-10-CM   1. History of cardioembolic cerebrovascular accident (CVA)  Z86.73     2. Hyperlipidemia, unspecified hyperlipidemia type  E78.5     3. Attention deficit hyperactivity disorder (ADHD), predominantly inattentive type  F90.0     4. Hyperglycemia  R73.9      I,Harris Phan,acting as a scribe for Tana Conch, MD.,have documented all relevant documentation on the behalf of Tana Conch, MD,as directed by  Tana Conch, MD while in the presence of Tana Conch, MD.   I, Tana Conch, MD, have reviewed all documentation for this visit. The documentation on 04/10/21 for the exam, diagnosis, procedures, and orders are all accurate and complete.   Return  precautions advised.  Garret Reddish, MD

## 2021-04-10 NOTE — Telephone Encounter (Signed)
Noted thanks °

## 2021-04-10 NOTE — Patient Instructions (Addendum)
Health Maintenance Due  Topic Date Due   Hepatitis C Screening consider next labs Never done   COVID-19 Vaccine (3 - Booster for [employer] series) Will call back with dates  06/11/2020   Please give [place] a call with your neurologist and cardiologist opinions about potentially restarting your ADHD medication or we can just discuss at next visit. If they agree to allow you to be placed back on the medicine, thn we will do a refill for you of a 90-day supply at lower dose 20 mg likely- it would be helpful to get records from psychiatry  Sign release of information at the check out desk for last visit from psychiatry  Continue current meds  Recommended follow up: keep august visit

## 2021-04-11 NOTE — Therapy (Addendum)
[facility] [address] [place], [place], [address] Phone: [phone]   Fax:  [phone]  Occupational Therapy Evaluation  Patient Details  Name: [facility] MRN: [id number] Date of Birth: 21-May-1996 Referring Provider (OT): Lynwood Dawley, DO   Encounter Date: 04/10/2021   OT End of Session - 04/10/21 1726     Visit Number 1    Number of Visits 9    Date for OT Re-Evaluation 05/22/21   2x/week for 4 weeks but date to allow for missed weeks and appts etc   Authorization Type [employer]    Authorization Time Period VL 30 PT/OT combined    OT Start Time 1702    OT Stop Time 1745    OT Time Calculation (min) 43 min    Activity Tolerance Patient tolerated treatment well    Behavior During Therapy [facility] for tasks assessed/performed             Past Medical History:  Diagnosis Date   ADHD (attention deficit hyperactivity disorder)    vyvanse 60mg    Asthma    exercise induced   Depression    anxiety/history when on 70mg  vyvanse- improved with decreased dose.    Migraines    Stroke [facility])    Tetralogy of Fallot    s/p repair. [place] [facility] saw cardiology until about age 25-told return 7 years.     Past Surgical History:  Procedure Laterality Date   GANGLION CYST EXCISION  2014   TEE WITHOUT CARDIOVERSION N/A 04/07/2021   Procedure: TRANSESOPHAGEAL ECHOCARDIOGRAM (TEE);  Surgeon: [facility], MD;  Location: [facility] ENDOSCOPY;  Service: Cardiovascular;  Laterality: N/A;   TETRALOGY OF FALLOT REPAIR      There were no vitals filed for this visit.   Subjective Assessment - 04/10/21 1728     Subjective  Pt is a 25 year old that presents to OPOT s/p CVA with residual RUE weakness. Pt reports deficits with digits 4 and 5 mainly of RUE. 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 Pt reports he feels that is about 70% back to his PLOF but is hoping to  get to 100%. Pt denies any pain currently. Pt reports most difficulty with holding fine or smaller ites (i.e. toothbrush, spoon, pencil, etc).    Patient is accompanied by: Family member   spouse, Polly   Pertinent History PMH: ADHD, tetralogy of Fallot status post repair, tobacco dependence    Patient Stated Goals "get to 100% recovery"    Currently in Pain? No/denies               [facility] OT Assessment - 04/10/21 1708       Assessment   Medical Diagnosis L MCA CVA    Referring Provider (OT) [facility], DO    Hand Dominance Right      Precautions   Precautions None      Balance Screen   Has the patient fallen in the past 6 months No      Home  Environment   Family/patient expects to be discharged to: Private residence    Living Arrangements Spouse/significant other   +dog   Type of Home House    Home Access Stairs   3   Home Layout Able to live on main level with bedroom/bathroom    Bathroom 04/12/21 Yes      Prior Function  Level of Independence Independent    Vocation Full time employment    Higher education careers adviser    Leisure own own business, tennis, gym, grill      ADL   Eating/Feeding Modified independent    Grooming Modified independent    Upper Body Bathing Modified independent    Lower Body Bathing Modified independent    Upper Body Dressing Independent    Lower Body Dressing Modified independent    Toilet Transfer Modified independent    Toileting - Clothing Manipulation Modified independent    Toileting -  Hygiene Modified Independent    Tub/Shower Transfer Modified independent      IADL   Prior Level of Function Shopping independent    Shopping Takes care of all shopping needs independently    Prior Level of Function Light Housekeeping didn't do much before    Light Housekeeping Maintains house alone or with occasional assistance   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, Public house manager   Prior Level of  Function Meal Prep independent    Meal Prep Plans, prepares and serves adequate meals independently    Prior Level of Function Psychologist, occupational own vehicle      Written Expression   Dominant Hand Right    Handwriting 90% legible   not as fluid     Vision - History   Baseline Vision Wears glasses for distance only   driving     Observation/Other Assessments   Focus on Therapeutic Outcomes (FOTO)  77%   predicted outcome: 96%     Sensation   Light Touch Appears Intact    Hot/Cold Appears Intact      Coordination   Finger Nose Finger Test intact    9 Hole Peg Test Right;Left    Right 9 Hole Peg Test 28.13s    Left 9 Hole Peg Test 24.10s      ROM / Strength   AROM / PROM / Strength AROM;Strength      AROM   Overall AROM  Within functional limits for tasks performed      Strength   Overall Strength Within functional limits for tasks performed      Hand Function   Right Hand Gross Grasp Functional    Right Hand Grip (lbs) 115.3    Left Hand Gross Grasp Functional    Left Hand Grip (lbs) 131.6                               OT Short Term Goals - 04/10/21 1751       OT SHORT TERM GOAL #1   Title LTGs only.               OT Long Term Goals - 04/11/21 1225       OT LONG TERM GOAL #1   Title Pt will be independent with HEP    Time 4    Period Weeks    Status New    Target Date 05/16/21      OT LONG TERM GOAL #2   Title Pt will write a paragraph with 100% legibility and no reports of fatigue with RUE.    Time 4    Period Weeks    Status New      OT LONG TERM GOAL #3   Title Pt will increase grip strength in right, dominant, hand to 120 lbs or greater.    Baseline  R 115.3, L 131.6    Time 4    Period Weeks    Status New      OT LONG TERM GOAL #4   Title Pt will increase coordination in RUE by complete 9 hole peg test in 24 seconds or less.    Baseline R 28.13s    Time 4    Period Weeks     Status New      OT LONG TERM GOAL #5   Title Pt report increase ease with manipulating utensils (spoon, pen, charcoal for drawing, toothbrush)    Time 4    Period Weeks    Status New      OT LONG TERM GOAL #6   Title Pt will complete FOTO at discharge and score 95% or greater    Baseline 77% at eval.    Time 4    Period Weeks    Status New                   Plan - 04/10/21 1747     Clinical Impression Statement Pt is a 25 year old male that presents to Neuro OPOT s/p CVA of L MCA with residual RUE weakness. Pt with overall good return but with continued weakness in RUE hand, specifically digits 4 and 5, impeding overall fine motor coordination and strength with completing ADLs and IADLs and return to work. Skilled occupational therapy is recommended to target listed areas of deficit and increase independence and return to PLOF.    OT Occupational Profile and History Problem Focused Assessment - Including review of records relating to presenting problem    Occupational performance deficits (Please refer to evaluation for details): ADL's;IADL's;Work;Leisure    Body Structure / Function / Physical Skills Strength;GMC;ADL;UE functional use;Dexterity;FMC;Coordination;IADL    Rehab Potential Good    Clinical Decision Making Limited treatment options, no task modification necessary    Comorbidities Affecting Occupational Performance: None    Modification or Assistance to Complete Evaluation  No modification of tasks or assist necessary to complete eval    OT Frequency 2x / week    OT Duration 6 weeks   2x/week for 4 weeks over period of 6 weeks   OT Treatment/Interventions Self-care/ADL training;Therapeutic activities;Therapeutic exercise;Patient/family education    Plan Coodination HEP RUE    Consulted and Agree with Plan of Care Patient;Family member/caregiver    Family Member Consulted spouse, Polly             Patient will benefit from skilled therapeutic intervention in  order to improve the following deficits and impairments:   Body Structure / Function / Physical Skills: Strength, GMC, ADL, UE functional use, Dexterity, FMC, Coordination, IADL       Visit Diagnosis: Other lack of coordination - Plan: Ot plan of care cert/re-cert  Muscle weakness (generalized) - Plan: Ot plan of care cert/re-cert  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) - Plan: Ot plan of care cert/re-cert    Problem List Patient Active Problem List   Diagnosis Date Noted   Hyperglycemia 04/10/2021   Hyperlipidemia 04/10/2021   History of cardioembolic cerebrovascular accident (CVA) 04/07/2021   Former smoker 05/02/2018   Ganglion cyst of dorsum of left wrist 05/02/2018   Acne 05/01/2015   Asthma    Migraines    Depression    ADHD (attention deficit hyperactivity disorder)    Tetralogy of Fallot     Junious DresserKirstyn M Gunhild Bautch MOT, OTR/L  04/11/2021, 2:12 PM  [facility] [address] [place], [place], [address] Phone: [phone]   Fax:  [phone]  Name: Carlos [facility] MRN: [id number] Date of Birth: 10/08/1996

## 2021-04-16 ENCOUNTER — Other Ambulatory Visit: Payer: Self-pay

## 2021-04-16 ENCOUNTER — Encounter: Payer: Self-pay | Admitting: Cardiovascular Disease

## 2021-04-16 ENCOUNTER — Ambulatory Visit: Payer: No Typology Code available for payment source | Admitting: Cardiovascular Disease

## 2021-04-16 VITALS — BP 110/62 | HR 98 | Ht 70.0 in | Wt 279.2 lb

## 2021-04-16 DIAGNOSIS — Q213 Tetralogy of Fallot: Secondary | ICD-10-CM | POA: Diagnosis not present

## 2021-04-16 DIAGNOSIS — Q211 Atrial septal defect: Secondary | ICD-10-CM

## 2021-04-16 DIAGNOSIS — Q2112 Patent foramen ovale: Secondary | ICD-10-CM

## 2021-04-16 NOTE — H&P (View-Only) (Signed)
Cardiology Office Note:    Date:  04/20/2021   ID:  [facility], DOB 1996/02/27, MRN [id number]  PCP:  Shelva Majestic, MD   [facility] Providers Cardiologist:  None     Referring MD: Verdia Kuba, DO   Chief Complaint  Patient presents with   PFO     History of Present Illness:    [facility] is a 25 y.o. male presents for evaluation of PFO, referred by Dr Roda Shutters and Dr Jens Som.   He underwent Tetrology of Fallot repair at [facility] at 2 months old. He had no limitation throughout his youth and played sports throughout middle and high school with no problems. He was told the surgical repair was 'incredibly succesful' and he was released from routine follow-up at age 39.   About 10 days ago he woke up with weakness and clumsiness in his right arm. Symptoms persisted throughout the day and after about 10-12 hours they went to [facility] and was found to have evidence of stroke. He was advised to go to [facility] for further evaluation and care.  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  A PFO is found on TEE imaging and he is referred for consideration of PFO closure.  His strength is almost fully back but his dexterity is not back to normal. He was a smoker until 3 years ago. He has been vaping until 3 weeks prior to the stroke. Today, he denies symptoms of palpitations, chest pain, shortness of breath, orthopnea, PND, lower extremity edema, dizziness, or syncope.   Past Medical History:  Diagnosis Date   ADHD (attention deficit hyperactivity disorder)    vyvanse 60mg    Asthma    exercise induced   Depression    anxiety/history when on 70mg  vyvanse- improved with decreased dose.    Migraines    Stroke [facility])    Tetralogy of Fallot    s/p repair. [place] [facility] saw cardiology until about age 35-told return 7 years.     Past Surgical History:   Procedure Laterality Date   GANGLION CYST EXCISION  2014   TEE WITHOUT CARDIOVERSION N/A 04/07/2021   Procedure: TRANSESOPHAGEAL ECHOCARDIOGRAM (TEE);  Surgeon: [facility], MD;  Location: [facility];  Service: Cardiovascular;  Laterality: N/A;   TETRALOGY OF FALLOT REPAIR      Current Medications: Current Meds  Medication Sig   aspirin EC 81 MG EC tablet Take 1 tablet (81 mg total) by mouth daily. Swallow whole.   atorvastatin (LIPITOR) 80 MG tablet Take 1 tablet (80 mg total) by mouth daily.   clopidogrel (PLAVIX) 75 MG tablet Take 1 tablet (75 mg total) by mouth daily.   Melatonin 10 MG TABS Take 10 mg by mouth at bedtime as needed (sleep).     Allergies:   Patient has no known allergies.   Social History   Socioeconomic History   Marital status: Married    Spouse name: Not on file   Number of children: Not on file   Years of education: Not on file   Highest education level: Not on file  Occupational History   Not on file  Tobacco Use   Smoking status: Former    Packs/day: 0.50    Pack years: 0.00    Types: Cigarettes    Start date: 10/12/2013    Quit date: 02/09/2018    Years since quitting: 3.1   Smokeless tobacco: Never  Vaping Use   Vaping Use: Never used  Substance and Sexual Activity   Alcohol use: Yes    Alcohol/week: 1.0 standard drink    Types: 1 Standard drinks or equivalent per week    Comment: on occassion. Last beer at midnight tonight    Drug use: No   Sexual activity: Yes    Partners: Female    Birth control/protection: Condom  Other Topics Concern   Not on file  Social History Narrative   Family: Engaged May 2019- wedding considering 2020 but concerned with covid (before she starts PA school). Adopted Son of [facility] of replacements limited (also patient of Dr. Durene Cal). 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    real estate  plans delayed.    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   Social Determinants of Health   Financial Resource Strain: Not on file  Food Insecurity: Not on file  Transportation Needs: Not on file  Physical Activity: Not on file  Stress: Not on file  Social Connections: Not on file     Family History: The patient's family history includes Alcohol abuse in an other family member; Other in his mother.  ROS:   Please see the history of present illness.    All other systems reviewed and are negative.  EKGs/Labs/Other Studies Reviewed:    The following studies were reviewed today: CT angiogram head/neck: IMPRESSION: Negative CTA. No stenosis, visible vasculopathy, or evident embolic source.  MRI brain 04/06/2021: IMPRESSION: MRI HEAD IMPRESSION:   1. Patchy multifocal acute ischemic nonhemorrhagic left MCA distribution infarcts. No associated mass effect. Findings likely embolic in nature. 2. Otherwise normal brain MRI.  Lower extremity venous duplex 04/07/2021: Summary:  RIGHT:  - There is no evidence of deep vein thrombosis in the lower extremity.     - No cystic structure found in the popliteal fossa.     LEFT:  - There is no evidence of deep vein thrombosis in the lower extremity.     - No cystic structure found in the popliteal fossa.   Transesophageal echo 04/07/2021: IMPRESSIONS     1. Mildy overriding aorta; no residual VSD; normal RV; mild PI; patent  foramen ovale visualized.   2. Left ventricular ejection fraction, by estimation, is 55 to 60%. The  left ventricle has normal function.   3. Right ventricular systolic function is normal. The right ventricular  size is normal.   4. No left atrial/left atrial appendage thrombus was detected.   5. The mitral valve is normal in structure. Trivial mitral valve  regurgitation.   6. The aortic valve is tricuspid. Aortic valve regurgitation is  not  visualized.   7. Aortic dilatation noted. There is mild dilatation of the aortic root,  measuring 41 mm.   8. Evidence of atrial level shunting detected by color flow Doppler.   EKG:  EKG from 04/06/2021 shows sinus rhythm, rightward axis, incomplete right bundle branch block  Recent Labs: 04/07/2021: BUN 16; Creatinine, Ser 1.08; Hemoglobin 15.9; Platelets 273; Potassium 4.0; Sodium 137  Recent Lipid Panel    Component Value Date/Time   CHOL 213 (H) 04/06/2021 0105   TRIG 141 04/06/2021 0105   HDL 38 (L) 04/06/2021 0105   CHOLHDL 5.6 04/06/2021 0105  VLDL 28 04/06/2021 0105   LDLCALC 147 (H) 04/06/2021 0105     Risk Assessment/Calculations:           Physical Exam:    VS:  BP 110/62   Pulse 98   Ht 5\' 10"  (1.778 m)   Wt 279 lb 3.2 oz (126.6 kg)   SpO2 96%   BMI 40.06 kg/m     Wt Readings from Last 3 Encounters:  04/16/21 279 lb 3.2 oz (126.6 kg)  04/10/21 276 lb 3.2 oz (125.3 kg)  04/07/21 273 lb 9.5 oz (124.1 kg)     GEN:  Well nourished, well developed in no acute distress HEENT: Normal NECK: No JVD; No carotid bruits LYMPHATICS: No lymphadenopathy CARDIAC: RRR, no murmurs, rubs, gallops RESPIRATORY:  Clear to auscultation without rales, wheezing or rhonchi  ABDOMEN: Soft, non-tender, non-distended MUSCULOSKELETAL:  No edema; No deformity  SKIN: Warm and dry NEUROLOGIC:  Alert and oriented x 3 PSYCHIATRIC:  Normal affect   ASSESSMENT:    1. PFO (patent foramen ovale)   2. Tetralogy of Fallot    PLAN:    In order of problems listed above:  The patient's echocardiogram is personally reviewed and demonstrates normal LV and RV function, no significant valvular disease, and a moderate sized PFO with color flow Doppler across the defect.  Bubble study is positive.  Other [facility] records are reviewed and demonstrate no evidence of any cardiac arrhythmia.  A CT angiogram of the head and neck is normal.  There is no significant atherosclerotic disease  identified.  Lab work is also unrevealing.  The patient's echocardiogram shows no ongoing problems related to tetrology of fallot repair. We reviewed the pathophysiology of PFO and cryptogenic stroke and discussed clinical trial data regarding PFO closure. The patient's ROPE score is 9, indicating a very high likelihood that his PFO is pathogenic. I reviewed the risks, indications, and alternatives to transcatheter PFO closure with the patient. Specific risks include bleeding, infection, device embolization, stroke, cardiac perforation, tamponade, arrhythmia, MI, and late device erosion. He understands these serious risks occur at low incidence of < 1%.  He would like to proceed at the next available time and he provides full informed consent for the procedure.  Surgically repaired in infancy, no residual sequelae.    Shared Decision Making/Informed Consent The risks [stroke (1 in 1000), death (1 in 1000), kidney failure [usually temporary] (1 in 500), bleeding (1 in 200), allergic reaction [possibly serious] (1 in 200)], benefits (diagnostic support and management of coronary artery disease) and alternatives of a cardiac catheterization were discussed in detail with Mr. Toniann Ketoteat-Smith and he is willing to proceed.    Medication Adjustments/Labs and Tests Ordered: Current medicines are reviewed at length with the patient today.  Concerns regarding medicines are outlined above.  No orders of the defined types were placed in this encounter.  No orders of the defined types were placed in this encounter.   Patient Instructions  CLOSURE INSTRUCTIONS (7/08): You are scheduled for a PFO/ASD CLOSURE on Friday, July 8 with Dr. Tonny BollmanMichael Clevie Prout.  1. [facility] at [facility] : [address] [place], [place] [id number] at 8:30 AM (This time is two hours before your procedure to ensure your preparation). Free valet parking service is available. You are allowed ONE  visitor in the waiting room during your procedure. Both you and your guest must wear masks. Special note: Every effort is made to have your procedure done  on time. Please understand that emergencies sometimes delay scheduled procedures.  2. Diet: Make sure to stay well hydrated the day before your procedure! Do not eat solid foods after midnight.  You may have clear liquids until 5am upon the day of the procedure.  3. Medication instructions in preparation for your procedure:  1) MAKE SURE TO TAKE YOUR ASPIRIN AND PLAVIX the morning of your closure 2) Other meds may be taken as directed with a sip of water.  4. Plan for one night stay--bring personal belongings (this is a disclaimer, not an intention. We want you to go home!). 5. Bring a current list of your medications and current insurance cards. 6. You MUST have a responsible person to drive you home. 7. Someone MUST be with you the first 24 hours after you arrive home or your discharge will be delayed. 8. Please wear clothes that are easy to get on and off and wear slip-on shoes.   FOLLOW-UP APPOINTMENT (8/10): You are scheduled for your 1 month follow-up on: 05/21/21 at 3:30PM with Carlean Jews, PA. [address]   Signed, Tonny Bollman, MD  04/20/2021 7:56 PM    [facility]

## 2021-04-16 NOTE — Patient Instructions (Addendum)
CLOSURE INSTRUCTIONS (7/08): You are scheduled for a PFO/ASD CLOSURE on Friday, July 8 with Dr. Tonny Bollman.  1. Please arrive at the [facility] (Main Entrance A) at [facility]: [address] [place], [place] [address] at 8:30 AM (This time is two hours before your procedure to ensure your preparation). Free valet parking service is available. You are allowed ONE visitor in the waiting room during your procedure. Both you and your guest must wear masks. Special note: Every effort is made to have your procedure done on time. Please understand that emergencies sometimes delay scheduled procedures.  2. Diet: Make sure to stay well hydrated the day before your procedure! Do not eat solid foods after midnight.  You may have clear liquids until 5am upon the day of the procedure.  3. Medication instructions in preparation for your procedure:  1) MAKE SURE TO TAKE YOUR ASPIRIN AND PLAVIX the morning of your closure 2) Other meds may be taken as directed with a sip of water.  4. Plan for one night stay--bring personal belongings (this is a disclaimer, not an intention. We want you to go home!). 5. Bring a current list of your medications and current insurance cards. 6. You MUST have a responsible person to drive you home. 7. Someone MUST be with you the first 24 hours after you arrive home or your discharge will be delayed. 8. Please wear clothes that are easy to get on and off and wear slip-on shoes.   FOLLOW-UP APPOINTMENT (8/10): You are scheduled for your 1 month follow-up on: 05/21/21 at 3:30PM with Carlean Jews, PA. [address]

## 2021-04-16 NOTE — Progress Notes (Signed)
Cardiology Office Note:    Date:  04/20/2021   ID:  Carlos Jacobs, DOB 1996/02/27, MRN [id number]  PCP:  Shelva Majestic, MD   [facility] Providers Cardiologist:  None     Referring MD: Verdia Kuba, DO   Chief Complaint  Patient presents with   PFO     History of Present Illness:    Carlos Jacobs is a 25 y.o. male presents for evaluation of PFO, referred by Dr Roda Shutters and Dr Jens Som.   He underwent Tetrology of Fallot repair at [facility] at 2 months old. He had no limitation throughout his youth and played sports throughout middle and high school with no problems. He was told the surgical repair was 'incredibly succesful' and he was released from routine follow-up at age 39.   About 10 days ago he woke up with weakness and clumsiness in his right arm. Symptoms persisted throughout the day and after about 10-12 hours they went to [facility] ER and was found to have evidence of stroke. He was advised to go to [facility] for further evaluation and care.  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  A PFO is found on TEE imaging and he is referred for consideration of PFO closure.  His strength is almost fully back but his dexterity is not back to normal. He was a smoker until 3 years ago. He has been vaping until 3 weeks prior to the stroke. Today, he denies symptoms of palpitations, chest pain, shortness of breath, orthopnea, PND, lower extremity edema, dizziness, or syncope.   Past Medical History:  Diagnosis Date   ADHD (attention deficit hyperactivity disorder)    vyvanse 60mg    Asthma    exercise induced   Depression    anxiety/history when on 70mg  vyvanse- improved with decreased dose.    Migraines    Stroke [facility])    Tetralogy of Fallot    s/p repair. [facility] saw cardiology until about age 35-told return 7 years.     Past Surgical History:   Procedure Laterality Date   GANGLION CYST EXCISION  2014   TEE WITHOUT CARDIOVERSION N/A 04/07/2021   Procedure: TRANSESOPHAGEAL ECHOCARDIOGRAM (TEE);  Surgeon: [facility], MD;  Location: [facility] ENDOSCOPY;  Service: Cardiovascular;  Laterality: N/A;   TETRALOGY OF FALLOT REPAIR      Current Medications: Current Meds  Medication Sig   aspirin EC 81 MG EC tablet Take 1 tablet (81 mg total) by mouth daily. Swallow whole.   atorvastatin (LIPITOR) 80 MG tablet Take 1 tablet (80 mg total) by mouth daily.   clopidogrel (PLAVIX) 75 MG tablet Take 1 tablet (75 mg total) by mouth daily.   Melatonin 10 MG TABS Take 10 mg by mouth at bedtime as needed (sleep).     Allergies:   Patient has no known allergies.   Social History   Socioeconomic History   Marital status: Married    Spouse name: Not on file   Number of children: Not on file   Years of education: Not on file   Highest education level: Not on file  Occupational History   Not on file  Tobacco Use   Smoking status: Former    Packs/day: 0.50    Pack years: 0.00    Types: Cigarettes    Start date: 10/12/2013    Quit date: 02/09/2018    Years since quitting: 3.1   Smokeless tobacco: Never  Vaping Use   Vaping Use: Never used  Substance and Sexual Activity   Alcohol use: Yes    Alcohol/week: 1.0 standard drink    Types: 1 Standard drinks or equivalent per week    Comment: on occassion. Last beer at midnight tonight    Drug use: No   Sexual activity: Yes    Partners: Female    Birth control/protection: Condom  Other Topics Concern   Not on file  Social History Narrative   Family: Engaged May 2019- wedding considering 2020 but concerned with covid (before she starts PA school). Adopted Son of [place] of replacements limited (also patient of Dr. Durene Cal). 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    real estate  plans delayed.    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   Social Determinants of Health   Financial Resource Strain: Not on file  Food Insecurity: Not on file  Transportation Needs: Not on file  Physical Activity: Not on file  Stress: Not on file  Social Connections: Not on file     Family History: The patient's family history includes Alcohol abuse in an other family member; Other in his mother.  ROS:   Please see the history of present illness.    All other systems reviewed and are negative.  EKGs/Labs/Other Studies Reviewed:    The following studies were reviewed today: CT angiogram head/neck: IMPRESSION: Negative CTA. No stenosis, visible vasculopathy, or evident embolic source.  MRI brain 04/06/2021: IMPRESSION: MRI HEAD IMPRESSION:   1. Patchy multifocal acute ischemic nonhemorrhagic left MCA distribution infarcts. No associated mass effect. Findings likely embolic in nature. 2. Otherwise normal brain MRI.  Lower extremity venous duplex 04/07/2021: Summary:  RIGHT:  - There is no evidence of deep vein thrombosis in the lower extremity.     - No cystic structure found in the popliteal fossa.     LEFT:  - There is no evidence of deep vein thrombosis in the lower extremity.     - No cystic structure found in the popliteal fossa.   Transesophageal echo 04/07/2021: IMPRESSIONS     1. Mildy overriding aorta; no residual VSD; normal RV; mild PI; patent  foramen ovale visualized.   2. Left ventricular ejection fraction, by estimation, is 55 to 60%. The  left ventricle has normal function.   3. Right ventricular systolic function is normal. The right ventricular  size is normal.   4. No left atrial/left atrial appendage thrombus was detected.   5. The mitral valve is normal in structure. Trivial mitral valve  regurgitation.   6. The aortic valve is tricuspid. Aortic valve regurgitation is  not  visualized.   7. Aortic dilatation noted. There is mild dilatation of the aortic root,  measuring 41 mm.   8. Evidence of atrial level shunting detected by color flow Doppler.   EKG:  EKG from 04/06/2021 shows sinus rhythm, rightward axis, incomplete right bundle branch block  Recent Labs: 04/07/2021: BUN 16; Creatinine, Ser 1.08; Hemoglobin 15.9; Platelets 273; Potassium 4.0; Sodium 137  Recent Lipid Panel    Component Value Date/Time   CHOL 213 (H) 04/06/2021 0105   TRIG 141 04/06/2021 0105   HDL 38 (L) 04/06/2021 0105   CHOLHDL 5.6 04/06/2021 0105  VLDL 28 04/06/2021 0105   LDLCALC 147 (H) 04/06/2021 0105     Risk Assessment/Calculations:           Physical Exam:    VS:  BP 110/62   Pulse 98   Ht 5' 10" (1.778 m)   Wt 279 lb 3.2 oz (126.6 kg)   SpO2 96%   BMI 40.06 kg/m     Wt Readings from Last 3 Encounters:  04/16/21 279 lb 3.2 oz (126.6 kg)  04/10/21 276 lb 3.2 oz (125.3 kg)  04/07/21 273 lb 9.5 oz (124.1 kg)     GEN:  Well nourished, well developed in no acute distress HEENT: Normal NECK: No JVD; No carotid bruits LYMPHATICS: No lymphadenopathy CARDIAC: RRR, no murmurs, rubs, gallops RESPIRATORY:  Clear to auscultation without rales, wheezing or rhonchi  ABDOMEN: Soft, non-tender, non-distended MUSCULOSKELETAL:  No edema; No deformity  SKIN: Warm and dry NEUROLOGIC:  Alert and oriented x 3 PSYCHIATRIC:  Normal affect   ASSESSMENT:    1. PFO (patent foramen ovale)   2. Tetralogy of Fallot    PLAN:    In order of problems listed above:  The patient's echocardiogram is personally reviewed and demonstrates normal LV and RV function, no significant valvular disease, and a moderate sized PFO with color flow Doppler across the defect.  Bubble study is positive.  Other hospital records are reviewed and demonstrate no evidence of any cardiac arrhythmia.  A CT angiogram of the head and neck is normal.  There is no significant atherosclerotic disease  identified.  Lab work is also unrevealing.  The patient's echocardiogram shows no ongoing problems related to tetrology of fallot repair. We reviewed the pathophysiology of PFO and cryptogenic stroke and discussed clinical trial data regarding PFO closure. The patient's ROPE score is 9, indicating a very high likelihood that his PFO is pathogenic. I reviewed the risks, indications, and alternatives to transcatheter PFO closure with the patient. Specific risks include bleeding, infection, device embolization, stroke, cardiac perforation, tamponade, arrhythmia, MI, and late device erosion. He understands these serious risks occur at low incidence of < 1%.  He would like to proceed at the next available time and he provides full informed consent for the procedure.  Surgically repaired in infancy, no residual sequelae.    Shared Decision Making/Informed Consent The risks [stroke (1 in 1000), death (1 in 1000), kidney failure [usually temporary] (1 in 500), bleeding (1 in 200), allergic reaction [possibly serious] (1 in 200)], benefits (diagnostic support and management of coronary artery disease) and alternatives of a cardiac catheterization were discussed in detail with Carlos Jacobs and he is willing to proceed.    Medication Adjustments/Labs and Tests Ordered: Current medicines are reviewed at length with the patient today.  Concerns regarding medicines are outlined above.  No orders of the defined types were placed in this encounter.  No orders of the defined types were placed in this encounter.   Patient Instructions  CLOSURE INSTRUCTIONS (7/08): You are scheduled for a PFO/ASD CLOSURE on Friday, July 8 with Dr. Fransisco Messmer.  1. Please arrive at the North Tower (Main Entrance A) at [facility]: [address] [place], [place] [address] at 8:30 AM (This time is two hours before your procedure to ensure your preparation). Free valet parking service is available. You are allowed ONE  visitor in the waiting room during your procedure. Both you and your guest must wear masks. Special note: Every effort is made to have your procedure done   on time. Please understand that emergencies sometimes delay scheduled procedures.  2. Diet: Make sure to stay well hydrated the day before your procedure! Do not eat solid foods after midnight.  You may have clear liquids until 5am upon the day of the procedure.  3. Medication instructions in preparation for your procedure:  1) MAKE SURE TO TAKE YOUR ASPIRIN AND PLAVIX the morning of your closure 2) Other meds may be taken as directed with a sip of water.  4. Plan for one night stay--bring personal belongings (this is a disclaimer, not an intention. We want you to go home!). 5. Bring a current list of your medications and current insurance cards. 6. You MUST have a responsible person to drive you home. 7. Someone MUST be with you the first 24 hours after you arrive home or your discharge will be delayed. 8. Please wear clothes that are easy to get on and off and wear slip-on shoes.   FOLLOW-UP APPOINTMENT (8/10): You are scheduled for your 1 month follow-up on: 05/21/21 at 3:30PM with Carlean Jews, PA. [address]   Signed, Tonny Bollman, MD  04/20/2021 7:56 PM    [facility]

## 2021-04-17 ENCOUNTER — Telehealth: Payer: Self-pay

## 2021-04-17 NOTE — Telephone Encounter (Signed)
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  Since his PFO closure has been postponed to 7/22, he requests a letter from Dr. Excell Seltzer to give to his employer allowing him to return to work on 7/11 until 7/22. He said he can work with out without restrictions - whichever Dr. Excell Seltzer prefers.  He also requests the note to write him out the week after PFO closure so he doesn't overdo it at work.  Will route to Dr. Excell Seltzer.

## 2021-04-17 NOTE — Telephone Encounter (Signed)
The patient returned call and spoke with [employer]. Confirmed new date and time.

## 2021-04-17 NOTE — Telephone Encounter (Signed)
Due to provider illness, called to confirm with patient his PFO closure has been rescheduled to 7/22.  Left message to call back.

## 2021-04-18 ENCOUNTER — Encounter: Payer: Self-pay | Admitting: Cardiovascular Disease

## 2021-04-20 ENCOUNTER — Encounter: Payer: Self-pay | Admitting: Cardiovascular Disease

## 2021-04-21 ENCOUNTER — Telehealth: Payer: Self-pay

## 2021-04-21 NOTE — Telephone Encounter (Signed)
Type of form received: FMLA   Form should be Faxed to: [phone]   Is patient requesting call for pickup: [phone]    Form placed:  In [place] folder

## 2021-04-21 NOTE — Telephone Encounter (Signed)
See below, pt was seen by you on 04/10/21. Another visit needed for form completion?

## 2021-04-21 NOTE — Telephone Encounter (Signed)
Patient called in asking if Dr Durene Cal could help him and his wife with FMLA forms. Pt stated that he had a stroke 6/25. He said that his wife works at the hospital and a doctor needs to approve the FMLA. Can we fill out forms?

## 2021-04-21 NOTE — Telephone Encounter (Signed)
I can fill this out- have them make a copy of original form and fill out the copy to best of their ability (many folks also ask their HR about how to list things for the time they have had off) - I will use this as a template for form completion

## 2021-04-21 NOTE — Telephone Encounter (Signed)
Called and spoke with pt and pt states he will bring the forms by today.

## 2021-04-23 NOTE — Telephone Encounter (Signed)
Patient FMLA paperowkr has been faxed. Called and talked to patient and informed and put a copy up front.

## 2021-04-29 ENCOUNTER — Other Ambulatory Visit: Payer: Self-pay

## 2021-04-29 ENCOUNTER — Encounter: Payer: Self-pay | Admitting: Occupational Therapy

## 2021-04-29 ENCOUNTER — Ambulatory Visit: Payer: No Typology Code available for payment source | Attending: Family Medicine | Admitting: Occupational Therapy

## 2021-04-29 DIAGNOSIS — I69851 Hemiplegia and hemiparesis following other cerebrovascular disease affecting right dominant side: Secondary | ICD-10-CM | POA: Insufficient documentation

## 2021-04-29 DIAGNOSIS — M6281 Muscle weakness (generalized): Secondary | ICD-10-CM | POA: Insufficient documentation

## 2021-04-29 DIAGNOSIS — R278 Other lack of coordination: Secondary | ICD-10-CM | POA: Insufficient documentation

## 2021-04-29 NOTE — Therapy (Signed)
[facility] [address] [place], [place], [address] Phone: [phone]   Fax:  [phone]  Occupational Therapy Treatment  Patient Details  Name: Carlos Jacobs MRN: [id number] Date of Birth: Jun 21, 1996 Referring Provider (OT): Imagene Sheller, DO   Encounter Date: 04/29/2021   OT End of Session - 04/29/21 1750     Visit Number 2    Number of Visits 9    Date for OT Re-Evaluation 05/22/21   2x/week for 4 weeks but date to allow for missed weeks and appts etc   Authorization Type [employer]    Authorization Time Period VL 30 PT/OT combined    OT Start Time 1750    OT Stop Time 1804   d/c session   OT Time Calculation (min) 14 min    Activity Tolerance Patient tolerated treatment well    Behavior During Therapy [facility] for tasks assessed/performed             Past Medical History:  Diagnosis Date   ADHD (attention deficit hyperactivity disorder)    vyvanse 74m   Asthma    exercise induced   Depression    anxiety/history when on 719mvyvanse- improved with decreased dose.    Migraines    [facility]   Tet[facility]    s/p repair. [facility] saw cardiology until about age 25-toldeturn 7 years.     Past Surgical History:  Procedure Laterality Date   GANGLION CYST EXCISION  2014   TEE WITHOUT CARDIOVERSION N/A 04/07/2021   Procedure: TRANSESOPHAGEAL ECHOCARDIOGRAM (TEE);  Surgeon: CrLelon PerlaMD;  [facility];  Service: Cardiovascular;  Laterality: N/A;   TETRALOGY OF FALLOT REPAIR      There were no vitals filed for this visit.   Subjective Assessment - 04/29/21 1749     Subjective  Pt denies any pain. "I felt back to normal last time" "My pinky is cooperating more"    Pertinent History PMH: ADHD, tetralogy of Fallot status post repair, tobacco dependence    Patient Stated Goals "get to 100% recovery"    Currently in Pain? No/denies             OCCUPATIONAL  THERAPY DISCHARGE SUMMARY  Visits from Start of Care: 2  Current functional level related to goals / functional outcomes: Pt has made gains with occupational therapy. Pt increaesd grip strength and coordination in RUE since evaluation.    Remaining deficits: None noted.   Education / Equipment: HEP for coordination   Patient agrees to discharge. Patient goals were met. Patient is being discharged due to meeting the stated rehab goals..                OT Treatments/Exercises (OP) - 04/29/21 0001       ADLs   ADL Comments reviewed goals. pt has met all goals.      Exercises   Exercises Hand      Hand Exercises   Other Hand Exercises reviewed coordination activities to do for HEP. Pt verbalized and demonstrated understanding                      OT Short Term Goals - 04/10/21 1751       OT SHORT TERM GOAL #1   Title LTGs only.               OT Long Term Goals - 04/29/21 1751       OT  LONG TERM GOAL #1   Title Pt will be independent with HEP    Time 4    Period Weeks    Status Achieved   reviewed some coordination activities 04/29/21     OT LONG TERM GOAL #2   Title Pt will write a paragraph with 100% legibility and no reports of fatigue with RUE.    Time 4    Period Weeks    Status Achieved      OT LONG TERM GOAL #3   Title Pt will increase grip strength in right, dominant, hand to 120 lbs or greater.    Baseline R 115.3, L 131.6    Time 4    Period Weeks    Status Achieved   RUE 128.3 lbs     OT LONG TERM GOAL #4   Title Pt will increase coordination in RUE by complete 9 hole peg test in 24 seconds or less.    Baseline R 28.13s    Time 4    Period Weeks    Status Achieved   RUE 24.5s     OT LONG TERM GOAL #5   Title Pt report increase ease with manipulating utensils (spoon, pen, charcoal for drawing, toothbrush)    Time 4    Period Weeks    Status Achieved   pt reports increased ease     OT LONG TERM GOAL #6   Title  Pt will complete FOTO at discharge and score 95% or greater    Baseline 77% at eval.    Time 4    Period Weeks    Status Achieved   98% at discharge                  Plan - 04/29/21 1802     Clinical Impression Statement Pt has met all goals and is ready for discharge at this time.    OT Occupational Profile and History Problem Focused Assessment - Including review of records relating to presenting problem    Occupational performance deficits (Please refer to evaluation for details): ADL's;IADL's;Work;Leisure    Body Structure / Function / Physical Skills Strength;GMC;ADL;UE functional use;Dexterity;FMC;Coordination;IADL    Rehab Potential Good    Clinical Decision Making Limited treatment options, no task modification necessary    Comorbidities Affecting Occupational Performance: None    Modification or Assistance to Complete Evaluation  No modification of tasks or assist necessary to complete eval    OT Frequency 2x / week    OT Duration 6 weeks   2x/week for 4 weeks over period of 6 weeks   OT Treatment/Interventions Self-care/ADL training;Therapeutic activities;Therapeutic exercise;Patient/family education    Plan D/c OT    Consulted and Agree with Plan of Care Patient             Patient will benefit from skilled therapeutic intervention in order to improve the following deficits and impairments:   Body Structure / Function / Physical Skills: Strength, GMC, ADL, UE functional use, Dexterity, FMC, Coordination, IADL       Visit Diagnosis: Other lack of coordination  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)  Muscle weakness (generalized)    Problem List Patient Active Problem List   Diagnosis Date Noted   Hyperglycemia 04/10/2021   Hyperlipidemia 04/10/2021   History of cardioembolic cerebrovascular accident (CVA) 04/07/2021   Former smoker 05/02/2018   Ganglion cyst of dorsum of left wrist  05/02/2018   Acne 05/01/2015   Asthma  Migraines    Depression    ADHD (attention deficit hyperactivity disorder)    Tetralogy of Fallot     Zachery Conch MOT, OTR/L  04/29/2021, 6:05 PM  [facility] [address] [place], [place], [address] Phone: [phone]   Fax:  [phone]  Name: Carlos Jacobs MRN: [id number] Date of Birth: 10-08-1996

## 2021-05-01 ENCOUNTER — Telehealth: Payer: Self-pay | Admitting: *Deleted

## 2021-05-01 ENCOUNTER — Ambulatory Visit: Payer: No Typology Code available for payment source | Admitting: Occupational Therapy

## 2021-05-01 NOTE — Telephone Encounter (Signed)
Pt contacted pre-PFO closure scheduled at [facility] for: Friday May 02, 2021 9:30 AM Verified arrival time and place: [facility] Main Entrance A [facility]) at: 7:30 AM   No solid food after midnight prior to cath, clear liquids until 5 AM day of procedure.  AM meds can be  taken pre-cath with sips of water including: aspirin 81 mg Plavix 75 mg  Confirmed patient has responsible adult to drive home post procedure and be with patient first 24 hours after arriving home: yes  You are allowed ONE visitor in the waiting room during the time you are at the hospital for your procedure. Both you and your visitor must wear a mask once you enter the hospital.   Patient reports does not currently have any symptoms concerning for COVID-19 and no household members with COVID-19 like illness.        Reviewed procedure/mask/visitor instructions with patient.

## 2021-05-02 ENCOUNTER — Encounter (HOSPITAL_COMMUNITY)
Admission: RE | Disposition: A | Discharge status: Home / Self Care | Payer: Self-pay | Source: Home / Self Care | Attending: Cardiovascular Disease

## 2021-05-02 ENCOUNTER — Ambulatory Visit (HOSPITAL_BASED_OUTPATIENT_CLINIC_OR_DEPARTMENT_OTHER): Payer: No Typology Code available for payment source

## 2021-05-02 ENCOUNTER — Ambulatory Visit (HOSPITAL_COMMUNITY)
Admission: RE | Admit: 2021-05-02 | Discharge: 2021-05-02 | Disposition: A | Discharge status: Home / Self Care | Payer: No Typology Code available for payment source | Attending: Cardiovascular Disease | Admitting: Cardiovascular Disease

## 2021-05-02 DIAGNOSIS — Z8774 Personal history of (corrected) congenital malformations of heart and circulatory system: Secondary | ICD-10-CM | POA: Diagnosis not present

## 2021-05-02 DIAGNOSIS — Z7902 Long term (current) use of antithrombotics/antiplatelets: Secondary | ICD-10-CM | POA: Insufficient documentation

## 2021-05-02 DIAGNOSIS — Q2112 Patent foramen ovale: Secondary | ICD-10-CM

## 2021-05-02 DIAGNOSIS — Z79899 Other long term (current) drug therapy: Secondary | ICD-10-CM | POA: Insufficient documentation

## 2021-05-02 DIAGNOSIS — Z87891 Personal history of nicotine dependence: Secondary | ICD-10-CM | POA: Insufficient documentation

## 2021-05-02 DIAGNOSIS — Q211 Atrial septal defect: Secondary | ICD-10-CM | POA: Insufficient documentation

## 2021-05-02 DIAGNOSIS — Z7982 Long term (current) use of aspirin: Secondary | ICD-10-CM | POA: Insufficient documentation

## 2021-05-02 HISTORY — PX: PATENT FORAMEN OVALE(PFO) CLOSURE: CATH118300

## 2021-05-02 LAB — ECHOCARDIOGRAM LIMITED
Height: 70 in
S' Lateral: 2.9 cm
Weight: 4320 oz

## 2021-05-02 LAB — POCT ACTIVATED CLOTTING TIME: Activated Clotting Time: 271 seconds

## 2021-05-02 SURGERY — PATENT FORAMEN OVALE (PFO) CLOSURE
Anesthesia: LOCAL

## 2021-05-02 MED ORDER — FENTANYL CITRATE (PF) 100 MCG/2ML IJ SOLN
INTRAMUSCULAR | Status: DC | PRN
  Administered 2021-05-02: 25 ug via INTRAVENOUS

## 2021-05-02 MED ORDER — HEPARIN SODIUM (PORCINE) 1000 UNIT/ML IJ SOLN
INTRAMUSCULAR | Status: AC
  Filled 2021-05-02: qty 1

## 2021-05-02 MED ORDER — ONDANSETRON HCL 4 MG/2ML IJ SOLN: 4.0000 mg | Freq: Four times a day (QID) | INTRAMUSCULAR | Status: DC | PRN

## 2021-05-02 MED ORDER — DIAZEPAM 5 MG PO TABS: 5.0000 mg | ORAL_TABLET | ORAL | Status: DC | PRN

## 2021-05-02 MED ORDER — MIDAZOLAM HCL 2 MG/2ML IJ SOLN
INTRAMUSCULAR | Status: AC
  Filled 2021-05-02: qty 2

## 2021-05-02 MED ORDER — SODIUM CHLORIDE 0.9 % IV SOLN: 250.0000 mL | INTRAVENOUS | Status: DC | PRN

## 2021-05-02 MED ORDER — LIDOCAINE HCL (PF) 1 % IJ SOLN
INTRAMUSCULAR | Status: AC
  Filled 2021-05-02: qty 30

## 2021-05-02 MED ORDER — SODIUM CHLORIDE 0.9% FLUSH: 3.0000 mL | Freq: Two times a day (BID) | INTRAVENOUS | Status: DC

## 2021-05-02 MED ORDER — HEPARIN (PORCINE) IN NACL 1000-0.9 UT/500ML-% IV SOLN
INTRAVENOUS | Status: AC
  Filled 2021-05-02: qty 1000

## 2021-05-02 MED ORDER — CLOPIDOGREL BISULFATE 75 MG PO TABS: 75.0000 mg | ORAL_TABLET | ORAL | Status: DC

## 2021-05-02 MED ORDER — SODIUM CHLORIDE 0.9 % WEIGHT BASED INFUSION
1.0000 mL/kg/h | INTRAVENOUS | Status: DC
  Administered 2021-05-02: 1 mL/kg/h via INTRAVENOUS

## 2021-05-02 MED ORDER — LIDOCAINE HCL (PF) 1 % IJ SOLN
INTRAMUSCULAR | Status: DC | PRN
  Administered 2021-05-02: 10 mL

## 2021-05-02 MED ORDER — ACETAMINOPHEN 325 MG PO TABS: 650.0000 mg | ORAL_TABLET | ORAL | Status: DC | PRN

## 2021-05-02 MED ORDER — HEPARIN SODIUM (PORCINE) 1000 UNIT/ML IJ SOLN
INTRAMUSCULAR | Status: DC | PRN
  Administered 2021-05-02: 10000 [IU] via INTRAVENOUS

## 2021-05-02 MED ORDER — LABETALOL HCL 5 MG/ML IV SOLN: 10.0000 mg | INTRAVENOUS | Status: DC | PRN

## 2021-05-02 MED ORDER — ASPIRIN 81 MG PO CHEW: 81.0000 mg | CHEWABLE_TABLET | ORAL | Status: DC

## 2021-05-02 MED ORDER — SODIUM CHLORIDE 0.9% FLUSH: 3.0000 mL | INTRAVENOUS | Status: DC | PRN

## 2021-05-02 MED ORDER — HEPARIN (PORCINE) IN NACL 1000-0.9 UT/500ML-% IV SOLN
INTRAVENOUS | Status: DC | PRN
  Administered 2021-05-02 (×2): 500 mL

## 2021-05-02 MED ORDER — FENTANYL CITRATE (PF) 100 MCG/2ML IJ SOLN
INTRAMUSCULAR | Status: AC
  Filled 2021-05-02: qty 2

## 2021-05-02 MED ORDER — CEFAZOLIN IN SODIUM CHLORIDE 3-0.9 GM/100ML-% IV SOLN
3.0000 g | INTRAVENOUS | Status: AC
  Administered 2021-05-02: 3 g via INTRAVENOUS
  Filled 2021-05-02: qty 100

## 2021-05-02 MED ORDER — HYDRALAZINE HCL 20 MG/ML IJ SOLN: 10.0000 mg | INTRAMUSCULAR | Status: DC | PRN

## 2021-05-02 MED ORDER — MIDAZOLAM HCL 2 MG/2ML IJ SOLN
INTRAMUSCULAR | Status: DC | PRN
  Administered 2021-05-02: 2 mg via INTRAVENOUS

## 2021-05-02 MED ORDER — OXYCODONE HCL 5 MG PO TABS: 5.0000 mg | ORAL_TABLET | ORAL | Status: DC | PRN

## 2021-05-02 MED ORDER — SODIUM CHLORIDE 0.9 % WEIGHT BASED INFUSION
3.0000 mL/kg/h | INTRAVENOUS | Status: AC
  Administered 2021-05-02: 3 mL/kg/h via INTRAVENOUS

## 2021-05-02 SURGICAL SUPPLY — 19 items
CATH ACUNAV 8FR 90CM (CATHETERS) ×2 IMPLANT
CATH SUPER TORQUE PLUS 6F MPA1 (CATHETERS) ×2 IMPLANT
CLOSURE PERCLOSE PROSTYLE (VASCULAR PRODUCTS) ×4 IMPLANT
COVER SWIFTLINK CONNECTOR (BAG) ×2 IMPLANT
GUIDEWIRE AMPLATZER 1.5JX260 (WIRE) ×2 IMPLANT
MAT PREVALON FULL STRYKER (MISCELLANEOUS) ×2 IMPLANT
OCCLUDER PFO TALISMAN 25-18 (Prosthesis & Implant Heart) ×1 IMPLANT
PACK CARDIAC CATHETERIZATION (CUSTOM PROCEDURE TRAY) ×2 IMPLANT
PROTECTION STATION PRESSURIZED (MISCELLANEOUS) ×2
SHEATH DELIVERY TALISMAN 8F 80 (SHEATH) ×1 IMPLANT
SHEATH INTROD W/O MIN 9FR 25CM (SHEATH) ×2 IMPLANT
SHEATH PINNACLE 8F 10CM (SHEATH) ×2 IMPLANT
SHEATH PROBE COVER 6X72 (BAG) ×2 IMPLANT
STATION PROTECTION PRESSURIZED (MISCELLANEOUS) ×1 IMPLANT
TALISMAN DELIVERY SHEATH 8F 80 (SHEATH) ×2
TALISMAN PFO OCCLUDER 25-18 (Prosthesis & Implant Heart) ×2 IMPLANT
TRANSDUCER W/STOPCOCK (MISCELLANEOUS) ×2 IMPLANT
TUBING CIL FLEX 10 FLL-RA (TUBING) ×2 IMPLANT
WIRE EMERALD 3MM-J .035X150CM (WIRE) ×2 IMPLANT

## 2021-05-02 NOTE — Progress Notes (Signed)
  Echocardiogram 2D Echocardiogram has been performed.  Burnard Hawthorne 05/02/2021, 12:42 PM

## 2021-05-02 NOTE — Interval H&P Note (Signed)
History and Physical Interval Note:  05/02/2021 10:36 AM  Carlos Jacobs  has presented today for surgery, with the diagnosis of pfo.  The various methods of treatment have been discussed with the patient and family. After consideration of risks, benefits and other options for treatment, the patient has consented to  Procedure(s): PATENT FORAMEN OVALE (PFO) CLOSURE (N/A) as a surgical intervention.  The patient's history has been reviewed, patient examined, no change in status, stable for surgery.  I have reviewed the patient's chart and labs.  Questions were answered to the patient's satisfaction.     Tonny Bollman

## 2021-05-02 NOTE — Progress Notes (Signed)
Pt's right groin site noted to be oozing at 1400, manual pressure held for 20 minutes and site redressed. Dr. Excell Seltzer notified and came to see patient. Groin site is now clean, dry and intact.

## 2021-05-02 NOTE — Discharge Instructions (Signed)

## 2021-05-02 NOTE — Progress Notes (Signed)
Pt ambulated without difficulty or bleeding.   Discharged home with his wife who will drive and stay with pt x 24 hrs. 

## 2021-05-05 ENCOUNTER — Encounter (HOSPITAL_COMMUNITY): Payer: Self-pay | Admitting: Cardiovascular Disease

## 2021-05-06 ENCOUNTER — Ambulatory Visit: Payer: No Typology Code available for payment source | Admitting: Occupational Therapy

## 2021-05-08 ENCOUNTER — Encounter: Payer: No Typology Code available for payment source | Admitting: Occupational Therapy

## 2021-05-12 ENCOUNTER — Encounter: Payer: Self-pay | Admitting: Physician Assistant

## 2021-05-12 ENCOUNTER — Telehealth: Payer: Self-pay | Admitting: Cardiovascular Disease

## 2021-05-12 NOTE — Telephone Encounter (Signed)
Spoke with pt who states he is trying to return to work but needs a note as he works in [employer].  Pt states the note should include any specifics related to restrictions.  Note may be sent through pt's MyChart. Pt advised will forward information to Dr Excell Seltzer for review and recommendations.  Pt verbalizes understanding and agrees with current plan.

## 2021-05-12 NOTE — Telephone Encounter (Signed)
New message:    Patient need a note for work. Please adviser

## 2021-05-12 NOTE — Telephone Encounter (Signed)
Spoke with pt who gives RN verbal permission to speak with pt's supervisor, Theresia Lo to advise per Cline Crock, PA pt is able to return to work without restriction.    Phone call made to Mr Darlin Drop and advised RN has been given verbal permission by Doreene Burke. [place] to speak with him and advised pt may return to work without restriction per provider and letter has been sent to pt stating this.  Supervisor verbalizes understanding and thanked Charity fundraiser for the call.

## 2021-05-12 NOTE — Telephone Encounter (Signed)
Pt's supervisor is calling to confirm that he can go back to work with no restrictions

## 2021-05-13 ENCOUNTER — Other Ambulatory Visit: Payer: Self-pay

## 2021-05-13 ENCOUNTER — Encounter: Payer: No Typology Code available for payment source | Admitting: Occupational Therapy

## 2021-05-13 ENCOUNTER — Encounter: Payer: Self-pay | Admitting: Adult Health

## 2021-05-13 ENCOUNTER — Ambulatory Visit (INDEPENDENT_AMBULATORY_CARE_PROVIDER_SITE_OTHER): Payer: No Typology Code available for payment source | Admitting: Adult Health

## 2021-05-13 VITALS — BP 111/72 | HR 74 | Ht 70.0 in | Wt 274.6 lb

## 2021-05-13 DIAGNOSIS — E785 Hyperlipidemia, unspecified: Secondary | ICD-10-CM

## 2021-05-13 DIAGNOSIS — I63412 Cerebral infarction due to embolism of left middle cerebral artery: Secondary | ICD-10-CM | POA: Diagnosis not present

## 2021-05-13 DIAGNOSIS — Q2112 Patent foramen ovale: Secondary | ICD-10-CM

## 2021-05-13 DIAGNOSIS — Q211 Atrial septal defect: Secondary | ICD-10-CM

## 2021-05-13 NOTE — Patient Instructions (Addendum)
Continue aspirin 81 mg daily and clopidogrel 75 mg daily  and atorvastatin for secondary stroke prevention  Follow up with cardiology as scheduled on 8/25  We will check your cholesterol levels today - you will be notified via MyChart with results   Continue to follow up with PCP regarding cholesterol management  Maintain strict control of cholesterol with LDL cholesterol (bad cholesterol) goal below 70 mg/dL.       Followup in the future with me in 6 months or call earlier if needed       Thank you for coming to see [place] at [facility]. I hope we have been able to provide you high quality care today.  You may receive a patient satisfaction survey over the next few weeks. We would appreciate your feedback and comments so that we may continue to improve ourselves and the health of our patients.

## 2021-05-13 NOTE — Progress Notes (Signed)
[facility] [address] [place]. [place] [address] [phone]       HOSPITAL FOLLOW UP NOTE  [name] Date of Birth:  [birth year] Medical Record Number:  [id number]   Reason for Referral:  hospital stroke follow up    SUBJECTIVE:   CHIEF COMPLAINT:  Chief Complaint  Patient presents with   Cerebrovascular Accident    Rm 2, hospital FU  wife- Carlos Jacobs, "doing well"    HPI:   [name] is a 25 y.o. 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  Personally reviewed hospitalization pertinent progress notes, lab work and imaging summary provided.  Evaluated by Dr. Roda Shutters for scattered L MCA infarcts, cardioembolic likely due to PFO.  CTA head/neck unremarkable.  TEE EF 55 to 60%, no residue VSD but PFO present. ROPE score 9 -referred to Dr. Excell Seltzer to consider PFO closure and recommended DAPT until follow-up with Dr. Excell Seltzer.  LDL 147 -added atorvastatin 80 mg daily.  A1c 5.6.  Nicotine use via vaping.  No prior stroke history.  PT/OT recommended outpatient PT.  Today, 05/13/2021, Carlos Jacobs is being seen for hospital follow-up accompanied by his wife, Carlos Jacobs.  Reports great improvement of right hand weakness and completed OT after 2 visits.  He does still have occasional dexterity issues more so with ring finger and pinky finger which has only been affecting his writing.  He has not yet returned back to work in [employer] despite release back from cardiology - he hopes to return back tomorrow. Otherwise, he has returned back to doing everything he was doing prior without difficulty. Denies new stroke/TIA symptoms.  Underwent PFO closure by Dr. Excell Seltzer on 05/02/2021 without complication.  He has remained on aspirin and Plavix as well as atorvastatin without associated side effects.  Blood pressure today 111/72.  No further concerns at this  time.      ROS:   14 system review of systems performed and negative with exception of those listed in HPI  PMH:  Past Medical History:  Diagnosis Date   ADHD (attention deficit hyperactivity disorder)    vyvanse 60mg    Asthma    exercise induced   Depression    anxiety/history when on 70mg  vyvanse- improved with decreased dose.    Migraines    Stroke [facility])    Tetralogy of Fallot    s/p repair. [facility] saw cardiology until about age 49-told return 7 years.     PSH:  Past Surgical History:  Procedure Laterality Date   GANGLION CYST EXCISION  2014   PATENT FORAMEN OVALE(PFO) CLOSURE N/A 05/02/2021   Procedure: PATENT FORAMEN OVALE (PFO) CLOSURE;  Surgeon: [facility], MD;  Location: [facility] INVASIVE CV LAB;  Service: Cardiovascular;  Laterality: N/A;   TEE WITHOUT CARDIOVERSION N/A 04/07/2021   Procedure: TRANSESOPHAGEAL ECHOCARDIOGRAM (TEE);  Surgeon: [facility], MD;  Location: [facility] ENDOSCOPY;  Service: Cardiovascular;  Laterality: N/A;   TETRALOGY OF FALLOT REPAIR      Social History:  Social History   Socioeconomic History   Marital status: Married    Spouse name: [place]ooy   Number of children: Not on file   Years of education: Not on file   Highest education level: Not on file  Occupational History   Not on file  Tobacco Use   Smoking status: Former    Packs/day: 0.50    Types: Cigarettes  Start date: 10/12/2013    Quit date: 02/09/2018    Years since quitting: 3.2   Smokeless tobacco: Never  Vaping Use   Vaping Use: Never used  Substance and Sexual Activity   Alcohol use: Not Currently    Alcohol/week: 1.0 standard drink    Types: 1 Standard drinks or equivalent per week    Comment: 05/13/21 none, on occassion. Last beer at midnight tonight   Drug use: No   Sexual activity: Yes    Partners: Female    Birth control/protection: Condom  Other Topics Concern   Not on file  Social History Narrative   Family: Engaged May 2019- wedding considering 2020  but concerned with covid (before she starts PA school). Adopted Son of [place] of replacements limited (also patient of Dr. Durene Cal). 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    real estate plans delayed.    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   Social Determinants of Health   Financial Resource Strain: Not on file  Food Insecurity: Not on file  Transportation Needs: Not on file  Physical Activity: Not on file  Stress: Not on file  Social Connections: Not on file  Intimate Partner Violence: Not on file    Family History:  Family History  Adopted: Yes  Problem Relation Age of Onset   Other Mother        adopted   Alcohol abuse Other        biological parents per adopted father    Medications:   Current Outpatient Medications on File Prior to Visit  Medication Sig Dispense Refill   aspirin EC 81 MG EC tablet Take 1 tablet (81 mg total) by mouth daily. Swallow whole. 90 tablet 1   atorvastatin (LIPITOR) 80 MG tablet Take 1 tablet (80 mg total) by mouth daily. 90 tablet 0   clopidogrel (PLAVIX) 75 MG tablet Take 1 tablet (75 mg total) by mouth daily. 90 tablet 1   Melatonin 10 MG TABS Take 10 mg by mouth at bedtime as needed (sleep).     No current facility-administered medications on file prior to visit.    Allergies:  No Known Allergies    OBJECTIVE:  Physical Exam  Vitals:   05/13/21 0801  BP: 111/72  Pulse: 74  Weight: 274 lb 9.6 oz (124.6 kg)  Height: 5\' 10"  (1.778 m)   Body mass index is 39.4 kg/m. No results found.  Post stroke PHQ 2/9  Depression screen PHQ 2/9 05/13/2021  Decreased Interest 0  Down, Depressed, Hopeless 0  PHQ - 2 Score 0  Altered sleeping -  Tired, decreased energy -  Change in appetite -  Feeling bad or failure about yourself  -  Trouble  concentrating -  Moving slowly or fidgety/restless -  Suicidal thoughts -  PHQ-9 Score -  Difficult doing work/chores -     General: well developed, well nourished, pleasant young Caucasian male, seated, in no evident distress Head: head normocephalic and atraumatic.   Neck: supple with no carotid or supraclavicular bruits Cardiovascular: regular rate and rhythm, no murmurs Musculoskeletal: no deformity Skin:  no rash/petichiae Vascular:  Normal pulses all extremities   Neurologic Exam Mental Status: Awake and fully alert. Oriented to place and  time. Recent and remote memory intact. Attention span, concentration and fund of knowledge appropriate. Mood and affect appropriate.  Cranial Nerves: Fundoscopic exam reveals sharp disc margins. Pupils equal, briskly reactive to light. Extraocular movements full without nystagmus. Visual fields full to confrontation. Hearing intact. Facial sensation intact. Face, tongue, palate moves normally and symmetrically.  Motor: Normal bulk and tone. Normal strength in all tested extremity muscles Sensory.: intact to touch , pinprick , position and vibratory sensation.  Coordination: Rapid alternating movements normal in all extremities. Finger-to-nose and heel-to-shin performed accurately bilaterally. Gait and Station: Arises from chair without difficulty. Stance is normal. Gait demonstrates normal stride length and balance without use of assistive device. Tandem walk and heel toe without difficulty.  Romberg negative Reflexes: 1+ and symmetric. Toes downgoing.     NIHSS  0 Modified Rankin  1      ASSESSMENT: Carlos Jacobs is a 25 y.o. year old male with recent scattered L MCA infarcts on 04/05/2021, cardioembolic likely due to PFO s/p closure 05/02/2021.  Vascular risk factors include HLD, PFO, nicotine use (vaping), and hx of tetralogy of Fallot s/p repair as an infant.      PLAN:  L MCA strokes :  Residual deficit: Recovered  very well with subjective decreased right hand dexterity.  Unable to appreciate deficit on exam - provided work letter to return back to work Continue aspirin 81 mg daily and clopidogrel 75 mg daily  and atorvastatin 80 mg daily for secondary stroke prevention.  Plans to continue DAPT 6 mo post PFO closure then aspirin alone Discussed secondary stroke prevention measures and importance of close PCP follow up for aggressive stroke risk factor management. I have gone over the pathophysiology of stroke, warning signs and symptoms, risk factors and their management in some detail with instructions to go to the closest emergency room for symptoms of concern. PFO: ROPE score 9. S/p closure 05/02/2021 by Dr. Excell Seltzer without complication. Cards f/u visit 8/25 HLD: LDL goal <70. Recent LDL 147 -continue atorvastatin 80 mg daily.  Repeat lipid panel today    Follow up in 6 months or call earlier if needed   CC:  [facility] provider: Dr. Pearlean Brownie PCP: Shelva Majestic, MD    I spent 46 minutes of face-to-face and non-face-to-face time with patient and wife.  This included previsit chart review including recent hospitalization, lab review, study review, order entry, electronic health record documentation, patient education regarding recent stroke and etiology, secondary stroke prevention measures and aggressive stroke risk factor management, minimal residual deficits and return back to work and answered all questions to patient satisfaction   Ihor Austin, AGNP-BC  [facility] [address] [place], [place] [address]  Phone [phone] Fax [phone] Note: This document was prepared with digital dictation and possible smart phrase technology. Any transcriptional errors that result from this process are unintentional.

## 2021-05-14 LAB — LIPID PANEL
Chol/HDL Ratio: 3.5 ratio (ref 0.0–5.0)
Cholesterol, Total: 119 mg/dL (ref 100–199)
HDL: 34 mg/dL — ABNORMAL LOW (ref >39)
LDL Chol Calc (NIH): 70 mg/dL (ref 0–99)
Triglycerides: 73 mg/dL (ref 0–149)
VLDL Cholesterol Cal: 15 mg/dL (ref 5–40)

## 2021-05-15 ENCOUNTER — Encounter: Payer: No Typology Code available for payment source | Admitting: Occupational Therapy

## 2021-05-15 NOTE — Progress Notes (Signed)
I agree with the above plan 

## 2021-05-19 ENCOUNTER — Ambulatory Visit (INDEPENDENT_AMBULATORY_CARE_PROVIDER_SITE_OTHER): Payer: No Typology Code available for payment source | Admitting: Family Medicine

## 2021-05-19 ENCOUNTER — Encounter: Payer: Self-pay | Admitting: Family Medicine

## 2021-05-19 ENCOUNTER — Other Ambulatory Visit: Payer: Self-pay

## 2021-05-19 VITALS — BP 100/60 | HR 74 | Temp 97.9°F | Ht 70.0 in | Wt 276.4 lb

## 2021-05-19 DIAGNOSIS — Z87891 Personal history of nicotine dependence: Secondary | ICD-10-CM | POA: Diagnosis not present

## 2021-05-19 DIAGNOSIS — Z0000 Encounter for general adult medical examination without abnormal findings: Secondary | ICD-10-CM

## 2021-05-19 DIAGNOSIS — E785 Hyperlipidemia, unspecified: Secondary | ICD-10-CM | POA: Diagnosis not present

## 2021-05-19 DIAGNOSIS — I10 Essential (primary) hypertension: Secondary | ICD-10-CM

## 2021-05-19 DIAGNOSIS — G43919 Migraine, unspecified, intractable, without status migrainosus: Secondary | ICD-10-CM

## 2021-05-19 DIAGNOSIS — F909 Attention-deficit hyperactivity disorder, unspecified type: Secondary | ICD-10-CM | POA: Diagnosis not present

## 2021-05-19 DIAGNOSIS — R739 Hyperglycemia, unspecified: Secondary | ICD-10-CM | POA: Diagnosis not present

## 2021-05-19 DIAGNOSIS — Q2112 Patent foramen ovale: Secondary | ICD-10-CM

## 2021-05-19 DIAGNOSIS — E119 Type 2 diabetes mellitus without complications: Secondary | ICD-10-CM

## 2021-05-19 DIAGNOSIS — F325 Major depressive disorder, single episode, in full remission: Secondary | ICD-10-CM

## 2021-05-19 DIAGNOSIS — Z79899 Other long term (current) drug therapy: Secondary | ICD-10-CM

## 2021-05-19 DIAGNOSIS — K219 Gastro-esophageal reflux disease without esophagitis: Secondary | ICD-10-CM

## 2021-05-19 DIAGNOSIS — L709 Acne, unspecified: Secondary | ICD-10-CM

## 2021-05-19 DIAGNOSIS — Q211 Atrial septal defect: Secondary | ICD-10-CM

## 2021-05-19 LAB — POC URINALSYSI DIPSTICK (AUTOMATED)
Bilirubin, UA: NEGATIVE
Blood, UA: NEGATIVE
Glucose, UA: NEGATIVE
Ketones, UA: NEGATIVE
Leukocytes, UA: NEGATIVE
Nitrite, UA: NEGATIVE
Protein, UA: NEGATIVE
Spec Grav, UA: >=1.03 — AB (ref 1.010–1.025)
Urobilinogen, UA: 0.2 E.U./dL
pH, UA: 5.5 (ref 5.0–8.0)

## 2021-05-19 NOTE — Progress Notes (Signed)
Phone: [phone]    Subjective:  Patient presents today for their annual physical. Chief complaint-noted.   See problem oriented charting- Review of Systems  Constitutional:  Negative for chills and fever.  HENT:  Negative for hearing loss and tinnitus.   Eyes:  Negative for blurred vision and double vision.  Respiratory:  Negative for cough and shortness of breath.   Cardiovascular:  Negative for chest pain and palpitations.  Gastrointestinal:  Negative for heartburn, nausea and vomiting.  Genitourinary:  Negative for dysuria and frequency.  Musculoskeletal:  Negative for myalgias and neck pain.  Skin:  Negative for itching and rash.  Neurological:  Negative for dizziness and headaches.  Endo/Heme/Allergies:  Negative for polydipsia. Does not bruise/bleed easily.  Psychiatric/Behavioral:  Negative for depression and suicidal ideas.    The following were reviewed and entered/updated in epic: Past Medical History:  Diagnosis Date   ADHD (attention deficit hyperactivity disorder)    vyvanse 37m   Asthma    exercise induced   Depression    anxiety/history when on 744mvyvanse- improved with decreased dose.    Migraines    [facility]   Tetralogy of Fallot    s/p repair. [facility] saw cardiology until about age 25-toldeturn 7 years.    Patient Active Problem List   Diagnosis Date Noted   History of cardioembolic cerebrovascular accident (CVA) 04/07/2021    Priority: High   ADHD (attention deficit hyperactivity disorder)     Priority: High   Tetralogy of Fallot     Priority: High   Hyperglycemia 04/10/2021    Priority: Medium   Hyperlipidemia 04/10/2021    Priority: Medium   Asthma     Priority: Medium   Migraines     Priority: Medium   Depression     Priority: Medium   Former smoker 05/02/2018    Priority: Low   Ganglion cyst of dorsum of left wrist 05/02/2018    Priority: Low   Acne 05/01/2015    Priority: Low   PFO (patent foramen ovale)  05/02/2021   Past Surgical History:  Procedure Laterality Date   GANGLION CYST EXCISION  2014   PATENT FORAMEN OVALE(PFO) CLOSURE N/A 05/02/2021   Procedure: PATENT FORAMEN OVALE (PFO) CLOSURE;  Surgeon: CoSherren MochaMD;  [facility];  Service: Cardiovascular;  Laterality: N/A;   TEE WITHOUT CARDIOVERSION N/A 04/07/2021   Procedure: TRANSESOPHAGEAL ECHOCARDIOGRAM (TEE);  Surgeon: CrLelon PerlaMD;  [facility];  Service: Cardiovascular;  Laterality: N/A;   TETRALOGY OF FALLOT REPAIR      Family History  Adopted: Yes  Problem Relation Age of Onset   Other Mother        adopted   Alcohol abuse Other        biological parents per adopted father    Medications- reviewed and updated Current Outpatient Medications  Medication Sig Dispense Refill   aspirin EC 81 MG EC tablet Take 1 tablet (81 mg total) by mouth daily. Swallow whole. 90 tablet 1   atorvastatin (LIPITOR) 80 MG tablet Take 1 tablet (80 mg total) by mouth daily. 90 tablet 0   clopidogrel (PLAVIX) 75 MG tablet Take 1 tablet (75 mg total) by mouth daily. 90 tablet 1   Melatonin 10 MG TABS Take 10 mg by mouth at bedtime as needed (sleep).     No current facility-administered medications for this visit.    Allergies-reviewed and updated No Known Allergies  Social History  Social History Narrative   Family: Engaged May 2019- wedding considering 2020 but concerned with covid (before she starts PA school). Adopted Son of [place] of replacements limited (also patient of Dr. Yong Channel). 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      Objective:  BP 100/60   Pulse 74   Temp 97.9 F (36.6 C)  (Temporal)   Ht 5' 10" (1.778 m)   Wt 276 lb 6.4 oz (125.4 kg)   SpO2 96%   BMI 39.66 kg/m  Gen: NAD, resting comfortably HEENT: Mucous membranes are moist. Oropharynx normal Neck: no thyromegaly CV: RRR no murmurs rubs or gallops Lungs: CTAB no crackles, wheeze, rhonchi Abdomen: soft/nontender/nondistended/normal bowel sounds. No rebound or guarding.  Ext: no edema Skin: warm, dry Neuro: grossly normal, moves all extremities, PERRLA     Assessment and Plan:  25 y.o. male presenting for annual physical.  Health Maintenance counseling: 1. Anticipatory guidance: Patient counseled regarding regular dental exams - advised q6 months- starting back January 2023, eye exams- has seen within 6 months - nighttime driving issues only- has low grade rxs.  avoiding smoking and second hand smoke , limiting alcohol to 2 beverages per day-much less than that- none in last 6 weeks on blood thinners   2. Risk factor reduction:  Advised patient of need for regular exercise and diet rich and fruits and vegetables to reduce risk of heart attack and stroke. Exercise- gym 4x day/week plus active with construction. Diet-down 2 lbs from last visit. Peak of 295- continues to work on this. Lowest 253 before quitting smoking. Long term goal 200.  -had given up red meat now back to twice a month Wt Readings from Last 3 Encounters:  05/19/21 276 lb 6.4 oz (125.4 kg)  05/13/21 274 lb 9.6 oz (124.6 kg)  05/02/21 270 lb (122.5 kg)  3. Immunizations/screenings/ancillary studies- discussed fall flu shot. HCV screen with next labs  Immunization History  Administered Date(s) Administered   DTaP 03/24/1996, 06/01/1996, 07/20/1996, 06/25/1997, 09/07/2000   HPV Quadrivalent 05/16/2012, 07/20/2012, 11/29/2012   Hepatitis A 05/04/2011, 05/16/2012   Hepatitis B 1996/04/14, 02/22/1996, 12/11/1996   HiB (PRP-OMP) 03/24/1996, 06/01/1996, 07/20/1996, 05/15/1997   IPV 03/24/1996, 06/01/1996, 06/25/1997, 09/07/2000   Influenza  Nasal 07/31/2009, 08/12/2011, 07/20/2012   Influenza,inj,Quad PF,6+ Mos 08/10/2016, 08/06/2017   Influenza-Unspecified 08/27/2007, 08/08/2008, 09/21/2008, 09/04/2016   MMR 06/15/1997, 11/03/2002   Meningococcal Conjugate 05/04/2011   Meningococcal Polysaccharide 05/25/2013   PFIZER(Purple Top)SARS-COV-2 Vaccination 12/15/2019, 01/10/2020   Pneumococcal Polysaccharide-23 10/13/2014   Tdap 01/24/2007, 04/30/2017   Varicella 01/24/1997, 05/25/2013  4. Prostate cancer screening- no known family history (adopted), start at age 2  5. Colon cancer screening - no known family histor (adopted) start at age 35 unless blood in stool 6. Skin cancer screening/prevention- does not see dermatology. advised regular sunscreen use. Denies worrisome, changing, or new skin lesions.  7. Testicular cancer screening- advised monthly self exams  8. STD screening- patient opts out- only active with wife 9. former smoker- quit 2-3 weeks before June 2022. Will get ua.   Status of chronic or acute concerns   #history of  CVA due to PFO. Recent repair with Dr. Burt Knack. He is doing well. He was told 6 months plavix and aspirin long term. Continue statin  #history of tetralogy of repair- s/p repair- no planned follow up from [facility]  # ADD S:control: mild poor control off meds medication: in past up to 56m Vyvanse with psychiatry. Nicotine helped but now off.  - patient discussed with cardiology and they were ok restarting. Neurology also did not have concern per patient.   Controlled substance contract: will sign at next visit NCCSRS/PDMP reviewed: yes, no recent rx Original diagnosis: with psychiatry- last visit in 2018  Since the last visit has the patient had any: A/P: ADD mild poor control- somewhat tolerated being off but having osme issues now- we will try to restart at 30 mg once we get records   # #hyperlipidemia S: Medication:atorvastatin 850m Lab Results  Component Value Date   CHOL 119  05/13/2021   HDL 34 (L) 05/13/2021   LDLCALC 70 05/13/2021   TRIG 73 05/13/2021   CHOLHDL 3.5 05/13/2021   A/P: excellent control when checked 6 days ago- congratulated patient on progress- continue current rx   # Hyperglycemia S:  Medication: None Exercise and diet-patient had improved diet and exercise prior to stroke Lab Results  Component Value Date   HGBA1C 5.6 04/07/2021   HGBA1C 5.8 (H) 04/06/2021  A/P:  too soon for recheck- consider at next visit   # depression- no depressed symptoms or anxiety- will watch closely when starting vyvanse but usually on higher dose  Recommended follow up: Return in about 4 months (around 09/18/2021) for if we are able to start AV medicines.. Future Appointments  Date Time Provider [place]8/25/2022  4:00 PM ThCrista L[facility]VD[facility]  11/12/2021  8:45 AM McFrann RiderNP [facility] None  05/26/2022  8:40 AM HuMarin OlpMD [facility]   Lab/Order associations: no labs   ICD-10-CM   1. Preventative health care  Z00.00     2. Hyperlipidemia, unspecified hyperlipidemia type  E78.5     3. Attention deficit hyperactivity disorder (ADHD), unspecified ADHD type  F90.9       No orders of the defined types were placed in this encounter.  I,Jada Bradford,acting as a scribe for StGarret ReddishMD.,have documented all relevant documentation on the behalf of StGarret ReddishMD,as directed by  StGarret ReddishMD while in the presence of StGarret ReddishMD.  I, StGarret ReddishMD, have reviewed all documentation for this visit. The documentation on 05/19/21 for the exam, diagnosis, procedures, and orders are all accurate and complete.   Return precautions advised.   StGarret ReddishMD

## 2021-05-19 NOTE — Addendum Note (Signed)
Addended by: Lorn Junes on: 05/19/2021 04:39 PM   Modules accepted: Orders

## 2021-05-19 NOTE — Patient Instructions (Addendum)
Health Maintenance Due  Topic Date Due   Hepatitis C Screening  - next labs Never done   INFLUENZA VACCINE  - Please consider getting your flu shot in the Fall. If you get this outside of our office, please let us know. 05/12/2021   Please stop by lab before you go- urine only If you have mychart- we will send your results within 3 business days of [place] receiving them.  If you do not have mychart- we will call you about results within 5 business days of [place] receiving them.  *please also note that you will see labs on mychart as soon as they post. I will later go in and write notes on them- will say "notes from Dr. Durene Cal"  Let us know when you got your last covid shot   Team please re-request records from psychiatry, from Dr.Dansey. Place on my desk as soon as reported.   For Mr. Katrinka Blazing- Please let us know, if you do not hear from [place] about vyvanse within a month.  Great job on exercising and changing your lifestyle!  Recommended follow up: Return in about 4 months (around 09/18/2021) for if we are able to start AV medicines.Marland Kitchen

## 2021-05-20 ENCOUNTER — Encounter: Payer: No Typology Code available for payment source | Admitting: Occupational Therapy

## 2021-05-20 LAB — DRUG MONITORING, PANEL 8 WITH CONFIRMATION, URINE
6 Acetylmorphine: NEGATIVE ng/mL (ref <10)
Alcohol Metabolites: NEGATIVE ng/mL (ref <500)
Amphetamines: NEGATIVE ng/mL (ref <500)
Benzodiazepines: NEGATIVE ng/mL (ref <100)
Buprenorphine, Urine: NEGATIVE ng/mL (ref <5)
Cocaine Metabolite: NEGATIVE ng/mL (ref <150)
Creatinine: 263.9 mg/dL (ref >=20.0)
MDMA: NEGATIVE ng/mL (ref <500)
Marijuana Metabolite: NEGATIVE ng/mL (ref <20)
Opiates: NEGATIVE ng/mL (ref <100)
Oxidant: NEGATIVE ug/mL (ref <200)
Oxycodone: NEGATIVE ng/mL (ref <100)
pH: 5.6 (ref 4.5–9.0)

## 2021-05-20 LAB — DM TEMPLATE

## 2021-05-21 ENCOUNTER — Ambulatory Visit: Payer: No Typology Code available for payment source | Admitting: Physician Assistant

## 2021-05-22 ENCOUNTER — Telehealth: Payer: Self-pay | Admitting: Family Medicine

## 2021-05-22 ENCOUNTER — Encounter: Payer: No Typology Code available for payment source | Admitting: Occupational Therapy

## 2021-05-22 MED ORDER — LISDEXAMFETAMINE DIMESYLATE 20 MG PO CAPS: 20.0000 mg | ORAL_CAPSULE | Freq: Every day | ORAL | 0 refills | Status: DC

## 2021-05-22 NOTE — Telephone Encounter (Signed)
I received records from psychiatry-I sent in a dose of Vyvanse 20 mg for him to try-have him update me after a month-can try 30 mg if needed

## 2021-05-23 NOTE — Telephone Encounter (Signed)
Called and spoke with pt and below message given. 

## 2021-06-05 ENCOUNTER — Other Ambulatory Visit: Payer: Self-pay

## 2021-06-05 ENCOUNTER — Encounter: Payer: Self-pay | Admitting: Physician Assistant

## 2021-06-05 ENCOUNTER — Ambulatory Visit (INDEPENDENT_AMBULATORY_CARE_PROVIDER_SITE_OTHER): Payer: No Typology Code available for payment source | Admitting: Physician Assistant

## 2021-06-05 VITALS — BP 102/58 | HR 69 | Ht 70.0 in | Wt 275.6 lb

## 2021-06-05 DIAGNOSIS — Z8774 Personal history of (corrected) congenital malformations of heart and circulatory system: Secondary | ICD-10-CM

## 2021-06-05 DIAGNOSIS — Q213 Tetralogy of Fallot: Secondary | ICD-10-CM

## 2021-06-05 DIAGNOSIS — Z720 Tobacco use: Secondary | ICD-10-CM

## 2021-06-05 MED ORDER — AMOXICILLIN 500 MG PO TABS: ORAL_TABLET | ORAL | 1 refills | Status: DC

## 2021-06-05 NOTE — Progress Notes (Signed)
[facility]   MULTIDISCIPLINARY HEART VALVE CLINIC                                     Cardiology Office Note:    Date:  06/06/2021   ID:  Carlos Jacobs, DOB 02-08-1996, MRN [id number]  PCP:  Shelva Majestic, MD  [facility] Cardiologist:  [facility] Electrophysiologist:  None   Referring MD: Shelva Majestic, MD   1 month s/p PFO closure   History of Present Illness:    Carlos Jacobs is a 25 y.o. male with a hx of ADHD, morbid obesity, tetrology de fallot s/p remote repair, previous tobacco abuse now quit. HLD, cryptogenic CVA and PFO s/p percutaneous patent foramen ovale closure (05/02/21) who presents to clinic for follow up.   He underwent Tetrology of Fallot repair at [facility] at 2 months old. He had no limitation throughout his youth and played sports throughout middle and high school with no problems. He was told the surgical repair was 'incredibly succesful' and he was released from routine follow-up at age 21.   The patient presented to the emergency department on 04/05/21 due to right hand weakness.  CT head showed no acute abnormality.  CT angiogram of the head and neck showed no stenosis.  MRI of the brain showed scattered left MCA distribution strokes.  TEE showed a EF of 55 to 60% with no residual VSD but did show PFO present. LDL was 147. The patient was started on high intensity statin therapy.  A1c was 5.8 which makes the patient prediabetic.  Patient was counseled on lifestyle modifications.    He was evaluated by Dr. Excell Seltzer and underwent successful transcatheter PFO closure using a 25 mm Amplatzer PFO occluder on 05/02/21. Post op limited echo was normal with an EF of 50-55%. He was discharged on aspirin and Plavix.   Today the patient presents to clinic for follow up. Doing great. 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 No CP or SOB. No LE edema, orthopnea or PND. No dizziness or syncope. No  blood in stool or urine. No palpitations. Quit vaping before stroke. Continues to not smoke or vape.  Past Medical History:  Diagnosis Date   ADHD (attention deficit hyperactivity disorder)    vyvanse 60mg    Asthma    exercise induced   Depression    anxiety/history when on 70mg  vyvanse- improved with decreased dose.    Migraines    Stroke [facility])    Tetralogy of Fallot    s/p repair. [place] [facility] saw cardiology until about age 8-told return 7 years.     Past Surgical History:  Procedure Laterality Date   GANGLION CYST EXCISION  2014   PATENT FORAMEN OVALE(PFO) CLOSURE N/A 05/02/2021   Procedure: PATENT FORAMEN OVALE (PFO) CLOSURE;  Surgeon: [facility], MD;  Location: [facility] INVASIVE CV LAB;  Service: Cardiovascular;  Laterality: N/A;   TEE WITHOUT CARDIOVERSION N/A 04/07/2021   Procedure: TRANSESOPHAGEAL ECHOCARDIOGRAM (TEE);  Surgeon: [facility], MD;  Location: [facility];  Service: Cardiovascular;  Laterality: N/A;   TETRALOGY OF FALLOT REPAIR      Current Medications: Current Meds  Medication Sig   amoxicillin (AMOXIL) 500 MG tablet Take 4 tablet by mouth 1 hour before dental procedures.   aspirin EC 81 MG EC tablet Take 1 tablet (81 mg total) by mouth daily. Swallow whole.  atorvastatin (LIPITOR) 80 MG tablet Take 1 tablet (80 mg total) by mouth daily.   clopidogrel (PLAVIX) 75 MG tablet Take 1 tablet (75 mg total) by mouth daily.   lisdexamfetamine (VYVANSE) 20 MG capsule Take 1 capsule (20 mg total) by mouth daily.   Melatonin 10 MG TABS Take 10 mg by mouth at bedtime as needed (sleep).     Allergies:   Patient has no known allergies.   Social History   Socioeconomic History   Marital status: Married    Spouse name: Plooy   Number of children: Not on file   Years of education: Not on file   Highest education level: Not on file  Occupational History   Not on file  Tobacco Use   Smoking status: Former    Packs/day: 0.50    Types: Cigarettes    Start  date: 10/12/2013    Quit date: 02/09/2018    Years since quitting: 3.3   Smokeless tobacco: Never  Vaping Use   Vaping Use: Never used  Substance and Sexual Activity   Alcohol use: Not Currently    Alcohol/week: 1.0 standard drink    Types: 1 Standard drinks or equivalent per week    Comment: 05/13/21 none, on occassion. Last beer at midnight tonight   Drug use: No   Sexual activity: Yes    Partners: Female    Birth control/protection: Condom  Other Topics Concern   Not on file  Social History Narrative   Family: Engaged May 2019- wedding considering 2020 but concerned with covid (before she starts PA school). Adopted Son of [place] of replacements limited (also patient of Dr. Durene Cal). 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   Social Determinants of Health   Financial Resource Strain: Not on file  Food Insecurity: Not on file  Transportation Needs: Not on file  Physical Activity: Not on file  Stress: Not on file  Social Connections: Not on file     Family History: The patient's family history includes Alcohol abuse in an other family member; Other in his mother. He was adopted.  ROS:   Please see the history of present illness.    All other systems reviewed and are negative.  EKGs/Labs/Other Studies Reviewed:    The following studies were reviewed today:  05/02/21 PATENT FORAMEN OVALE (PFO) CLOSURE   Conclusion  Successful transcatheter PFO closure using a 25 mm Amplatzer PFO occluder device with intracardiac echo and fluoroscopic guidance   Recommendations  Antiplatelet/Anticoag Recommend uninterrupted dual antiplatelet therapy with Aspirin 81mg  daily and Clopidogrel 75mg   daily for 6 months.     _______________________   Limited Echo 05/02/21 IMPRESSIONS   1. Limited study; Amplatzer PFO occluder noted with no residual shunt.   2. Left ventricular ejection fraction, by estimation, is 50 to 55%. The  left ventricle has low normal function. The left ventricle has no regional  wall motion abnormalities.   3. The mitral valve is normal in structure.   4. Aortic dilatation noted. There is borderline dilatation of the aortic  root, measuring 38 mm. There is borderline dilatation of the ascending  aorta, measuring 39 mm.    EKG:  EKG is NOT ordered today.    Recent Labs: 04/07/2021: BUN 16; Creatinine, Ser 1.08; Hemoglobin 15.9; Platelets 273; Potassium 4.0; Sodium 137  Recent Lipid Panel    Component Value Date/Time   CHOL 119 05/13/2021 0843   TRIG 73 05/13/2021 0843   HDL 34 (L) 05/13/2021 0843   CHOLHDL 3.5 05/13/2021 0843   CHOLHDL 5.6 04/06/2021 0105   VLDL 28 04/06/2021 0105   LDLCALC 70 05/13/2021 0843     Risk Assessment/Calculations:       Physical Exam:    VS:  BP (!) 102/58   Pulse 69   Ht 5\' 10"  (1.778 m)   Wt 275 lb 9.6 oz (125 kg)   SpO2 97%   BMI 39.54 kg/m     Wt Readings from Last 3 Encounters:  06/05/21 275 lb 9.6 oz (125 kg)  05/19/21 276 lb 6.4 oz (125.4 kg)  05/13/21 274 lb 9.6 oz (124.6 kg)     GEN:  Well nourished, well developed in no acute distress, overweight HEENT: Normal NECK: No JVD; No carotid bruits LYMPHATICS: No lymphadenopathy CARDIAC: RRR, no murmurs, rubs, gallops RESPIRATORY:  Clear to auscultation without rales, wheezing or rhonchi  ABDOMEN: Soft, non-tender, non-distended MUSCULOSKELETAL:  No edema; No deformity  SKIN: Warm and dry NEUROLOGIC:  Alert and oriented x 3 PSYCHIATRIC:  Normal affect   ASSESSMENT:    1. S/P percutaneous patent foramen ovale closure   2. Tetralogy of Fallot   3. Morbid obesity, unspecified obesity type (HCC)   4. Tobacco abuse    PLAN:    In order of  problems listed above:  PFO s/p percutaneous closure:doing well. Continue on aspirin and plavix x 6 months and then aspirin alone. Can stop plavix 11/02/21. SBE prophylaxis discussed; I have RX'd amoxicillin as he has a dentist apt prior 6 months out from closure. We will see him back in 1 year for follow up and echo with bubble.   Tetralogy of Fallot: repaired in infancy with no residual sequelae  Obesity: Body mass index is 39.54 kg/m. He is also muscular so I think BMI is not very accurate. Very active at work. Continue lifestyle modifications.   Tobacco abuse: he has completely quit smoking and vaping.     Medication Adjustments/Labs and Tests Ordered: Current medicines are reviewed at length with the patient today.  Concerns regarding medicines are outlined above.  No orders of the defined types were placed in this encounter.  Meds ordered this encounter  Medications   amoxicillin (AMOXIL) 500 MG tablet    Sig: Take 4 tablet by mouth 1 hour before dental procedures.    Dispense:  4 tablet    Refill:  1     Patient Instructions  Medication Instructions:  Your physician has recommended you make the following change in your medication:  1-STOP Plavix on 11/02/21 2-Take Amoxicillin 2 grams (4 tablets) 1 hour before dental procedures.  *If you need a refill on your cardiac medications before your next appointment, please call your pharmacy*  Lab Work: If you have labs (blood work) drawn today and your tests are completely normal, you will receive your results only by: MyChart Message (if you have MyChart) OR A paper copy in the mail If you have any lab test that is abnormal or we need to change your treatment, we will call you to review the results.  Testing/Procedures: None ordered today.  Follow-Up: At [facility], you  and your health needs are our priority.  As part of our continuing mission to provide you with exceptional heart care, we have created designated Provider  Care Teams.  These Care Teams include your primary Cardiologist (physician) and Advanced Practice Providers (APPs -  Physician Assistants and Nurse Practitioners) who all work together to provide you with the care you need, when you need it.  We recommend signing up for the patient portal called "MyChart".  Sign up information is provided on this After Visit Summary.  MyChart is used to connect with patients for Virtual Visits (Telemedicine).  Patients are able to view lab/test results, encounter notes, upcoming appointments, etc.  Non-urgent messages can be sent to your provider as well.   To learn more about what you can do with MyChart, go to [email or web address].    Your next appointment:   1 year(s)  The format for your next appointment:   In Person  Provider:   Georgie Chard, NP    Signed, Cline Crock, PA-C  06/06/2021 10:25 AM    [facility]

## 2021-06-05 NOTE — Patient Instructions (Addendum)
Medication Instructions:  Your physician has recommended you make the following change in your medication:  1-STOP Plavix on 11/02/21 2-Take Amoxicillin 2 grams (4 tablets) 1 hour before dental procedures.  *If you need a refill on your cardiac medications before your next appointment, please call your pharmacy*  Lab Work: If you have labs (blood work) drawn today and your tests are completely normal, you will receive your results only by: MyChart Message (if you have MyChart) OR A paper copy in the mail If you have any lab test that is abnormal or we need to change your treatment, we will call you to review the results.  Testing/Procedures: None ordered today.  Follow-Up: At [facility], you and your health needs are our priority.  As part of our continuing mission to provide you with exceptional heart care, we have created designated Provider Care Teams.  These Care Teams include your primary Cardiologist (physician) and Advanced Practice Providers (APPs -  Physician Assistants and Nurse Practitioners) who all work together to provide you with the care you need, when you need it.  We recommend signing up for the patient portal called "MyChart".  Sign up information is provided on this After Visit Summary.  MyChart is used to connect with patients for Virtual Visits (Telemedicine).  Patients are able to view lab/test results, encounter notes, upcoming appointments, etc.  Non-urgent messages can be sent to your provider as well.   To learn more about what you can do with MyChart, go to [email or web address].    Your next appointment:   1 year(s)  The format for your next appointment:   In Person  Provider:   Georgie Chard, NP

## 2021-06-26 ENCOUNTER — Telehealth: Payer: Self-pay

## 2021-06-26 MED ORDER — LISDEXAMFETAMINE DIMESYLATE 20 MG PO CAPS: 20.0000 mg | ORAL_CAPSULE | Freq: Every day | ORAL | 0 refills | Status: DC

## 2021-06-26 NOTE — Telephone Encounter (Signed)
Refilled 3 months. Will need visit at 6 months out

## 2021-06-26 NOTE — Telephone Encounter (Signed)
LAST APPOINTMENT DATE:  05/19/21  NEXT APPOINTMENT DATE: 05/26/22  MEDICATION:lisdexamfetamine (VYVANSE) 20 MG capsule  PHARMACY: [id number] - [place], [place] - [address] AT [facility] OF [facility] [address]

## 2021-06-27 NOTE — Telephone Encounter (Signed)
Please schedule pt for 6 month OV for Vyvanse refills.

## 2021-07-04 ENCOUNTER — Telehealth: Payer: Self-pay

## 2021-07-04 MED ORDER — LISDEXAMFETAMINE DIMESYLATE 20 MG PO CAPS: 20.0000 mg | ORAL_CAPSULE | Freq: Every day | ORAL | 0 refills | Status: DC

## 2021-07-04 NOTE — Telephone Encounter (Signed)
Pt called  regarding his Vyvanse. He stated that he has already done his first visit for the prescription. He is also scheduled for a 6 month visit. Carlos Jacobs wants to know if Dr Durene Cal can call in 90 days for insurance reasons. Please Advise.

## 2021-07-04 NOTE — Telephone Encounter (Signed)
error 

## 2021-07-04 NOTE — Addendum Note (Signed)
Addended by: Shelva Majestic on: 07/04/2021 07:04 PM   Modules accepted: Orders

## 2021-07-04 NOTE — Telephone Encounter (Signed)
See below

## 2021-07-07 ENCOUNTER — Telehealth: Payer: Self-pay

## 2021-07-07 MED ORDER — ATORVASTATIN CALCIUM 80 MG PO TABS: 80.0000 mg | ORAL_TABLET | Freq: Every day | ORAL | 0 refills | Status: DC

## 2021-07-07 NOTE — Telephone Encounter (Signed)
Is anything else needed from me?

## 2021-07-07 NOTE — Telephone Encounter (Signed)
Atorvastatin refilled, Vyvanse filled on 06/26/21.

## 2021-07-07 NOTE — Telephone Encounter (Signed)
  Encourage patient to contact the pharmacy for refills or they can request refills through [facility]  LAST APPOINTMENT DATE:  05/19/2021  NEXT APPOINTMENT DATE:  MEDICATION: atorvastatin (LIPITOR) 80 MG tablet // lisdexamfetamine (VYVANSE) 20 MG capsule  Is the patient out of medication? No   PHARMACY: [id number] - [place], [place] - [address] AT [facility] OF [address]  COMMENTS: Patient is wanting a 90 day supply of Vyvanse for insurance purposes

## 2021-07-11 ENCOUNTER — Encounter: Payer: Self-pay | Admitting: Family Medicine

## 2021-07-11 MED ORDER — LISDEXAMFETAMINE DIMESYLATE 20 MG PO CAPS: 20.0000 mg | ORAL_CAPSULE | Freq: Every day | ORAL | 0 refills | Status: DC

## 2021-09-10 ENCOUNTER — Encounter: Payer: Self-pay | Admitting: Family Medicine

## 2021-09-18 MED ORDER — LISDEXAMFETAMINE DIMESYLATE 30 MG PO CAPS: 30.0000 mg | ORAL_CAPSULE | Freq: Every day | ORAL | 0 refills | Status: DC

## 2021-10-04 ENCOUNTER — Other Ambulatory Visit: Payer: Self-pay | Admitting: Family Medicine

## 2021-10-09 ENCOUNTER — Telehealth: Payer: Self-pay | Admitting: Family Medicine

## 2021-10-09 NOTE — Telephone Encounter (Signed)
Patient states he had a stoke 6 months ago and is about to run out of medication Patient cannot get in touch with cardio doc to ask questions about refill or what he needs to do  Please advise & call patient

## 2021-10-10 ENCOUNTER — Other Ambulatory Visit: Payer: Self-pay | Admitting: *Deleted

## 2021-10-10 ENCOUNTER — Other Ambulatory Visit: Payer: Self-pay

## 2021-10-10 ENCOUNTER — Other Ambulatory Visit: Payer: Self-pay | Admitting: Family Medicine

## 2021-10-10 ENCOUNTER — Encounter: Payer: Self-pay | Admitting: Family Medicine

## 2021-10-10 ENCOUNTER — Other Ambulatory Visit: Payer: Self-pay | Admitting: Physician Assistant

## 2021-10-10 MED ORDER — CLOPIDOGREL BISULFATE 75 MG PO TABS
75.0000 mg | ORAL_TABLET | Freq: Every day | ORAL | 0 refills | Status: DC
Start: 2021-10-10 — End: 2021-11-05

## 2021-10-10 MED ORDER — CLOPIDOGREL BISULFATE 75 MG PO TABS: 75.0000 mg | ORAL_TABLET | Freq: Every day | ORAL | 0 refills | Status: DC

## 2021-10-10 NOTE — Telephone Encounter (Signed)
Rx sent to pharmacy. Patient notified. 

## 2021-10-10 NOTE — Telephone Encounter (Signed)
Ok to send in for 30 day supply.  Katina Degree. Jimmey Ralph, MD 10/10/2021 11:33 AM

## 2021-10-10 NOTE — Telephone Encounter (Addendum)
Patient is requesting refill.  He is afraid of running out of medication over the weekend.  Patient requesting call back today.   Routing to [place] pool in [place] absence.

## 2021-10-10 NOTE — Telephone Encounter (Signed)
Patient called back Is completely out and would like cloidogrel 75mg  sent in he states he was given this from when he was in the hospital patient states he suppose to be taking it for another 30 days.  Patient would like a call back either way.

## 2021-11-05 ENCOUNTER — Telehealth: Payer: Self-pay | Admitting: Cardiovascular Disease

## 2021-11-05 MED ORDER — ASPIRIN EC 81 MG PO TBEC: 81.0000 mg | DELAYED_RELEASE_TABLET | Freq: Every day | ORAL | Status: AC

## 2021-11-05 NOTE — Telephone Encounter (Signed)
Patient made aware of Bonney Leitz, PA response. Patient will stop Plavix and continue Asa 81 mg daily. Patient verbalized understanding. Patient has no other questions or concerns at this time and voiced appreciation for the call back.

## 2021-11-05 NOTE — Telephone Encounter (Signed)
He can stop taking the Plavix now but continue on a baby aspirin!

## 2021-11-05 NOTE — Telephone Encounter (Signed)
New message:   Patient wants to know if he is still supposed to stop his blood thinner  after taking it for  6 months after his stroke?-

## 2021-11-05 NOTE — Telephone Encounter (Signed)
Fwd to Bary Castilla, PA to advise

## 2021-11-06 ENCOUNTER — Other Ambulatory Visit: Payer: Self-pay | Admitting: Family Medicine

## 2021-11-12 ENCOUNTER — Ambulatory Visit: Payer: No Typology Code available for payment source | Admitting: Adult Health

## 2021-11-12 NOTE — Progress Notes (Deleted)
[facility] [address] [place]. [place] [address] [phone]       STROKE FOLLOW UP NOTE  [name] Date of Birth:  09-28-1996 Medical Record Number:  [id number]   Reason for Referral: stroke follow up    SUBJECTIVE:   CHIEF COMPLAINT:  No chief complaint on file.   HPI:   Update 11/12/2021 JM: Returns for 58-month stroke follow-up.  Overall doing well.  Denies new stroke/TIA symptoms. Residual ***.  He has since returned back to work without difficulty.  Completed 6 months DAPT post PFO closure, remains on aspirin and atorvastatin without side effects.  Blood pressure today ***.  No new concerns at this time.     History provided for reference purposes only Initial visit 05/13/2021 JM: Carlos Jacobs is being seen for hospital follow-up accompanied by his wife, Steward Drone.  Reports great improvement of right hand weakness and completed OT after 2 visits.  He does still have occasional dexterity issues more so with ring finger and pinky finger which has only been affecting his writing.  He has not yet returned back to work in [employer] despite release back from cardiology - he hopes to return back tomorrow. Otherwise, he has returned back to doing everything he was doing prior without difficulty. Denies new stroke/TIA symptoms.  Underwent PFO closure by Dr. Burt Knack on [id number] without complication.  He has remained on aspirin and Plavix as well as atorvastatin without associated side effects.  Blood pressure today 111/72.  No further concerns at this time.  Stroke admission 04/05/2021 [name] is a 26 y.o. 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  Personally reviewed hospitalization pertinent progress notes, lab work and imaging summary provided.  Evaluated by Dr. Erlinda Hong for scattered L MCA infarcts, cardioembolic likely due to PFO.   CTA head/neck unremarkable.  TEE EF 55 to 60%, no residue VSD but PFO present. ROPE score 9 -referred to Dr. Burt Knack to consider PFO closure and recommended DAPT until follow-up with Dr. Burt Knack.  LDL 147 -added atorvastatin 80 mg daily.  A1c 5.6.  Nicotine use via vaping.  No prior stroke history.  PT/OT recommended outpatient PT.        ROS:   14 system review of systems performed and negative with exception of those listed in HPI  PMH:  Past Medical History:  Diagnosis Date   ADHD (attention deficit hyperactivity disorder)    vyvanse 60mg    Asthma    exercise induced   Depression    anxiety/history when on 70mg  vyvanse- improved with decreased dose.    Migraines    Stroke [facility]    Tetralogy of Fallot    s/p repair. [place] [facility]. saw cardiology until about age 35-told return 7 years.     PSH:  Past Surgical History:  Procedure Laterality Date   GANGLION CYST EXCISION  2014   PATENT FORAMEN OVALE(PFO) CLOSURE N/A 05/02/2021   Procedure: PATENT FORAMEN OVALE (PFO) CLOSURE;  Surgeon: Sherren Mocha, MD;  Location: [facility];  Service: Cardiovascular;  Laterality: N/A;   TEE WITHOUT CARDIOVERSION N/A 04/07/2021   Procedure: TRANSESOPHAGEAL ECHOCARDIOGRAM (TEE);  Surgeon: Lelon Perla, MD;  Location: [facility] ENDOSCOPY;  Service: Cardiovascular;  Laterality: N/A;   TETRALOGY OF FALLOT REPAIR      Social History:  Social History   Socioeconomic History   Marital status: Married    Spouse name:  Plooy   Number of children: Not on file   Years of education: Not on file   Highest education level: Not on file  Occupational History   Not on file  Tobacco Use   Smoking status: Former    Packs/day: 0.50    Types: Cigarettes    Start date: 10/12/2013    Quit date: 02/09/2018    Years since quitting: 3.7   Smokeless tobacco: Never  Vaping Use   Vaping Use: Never used  Substance and Sexual Activity   Alcohol use: Not Currently    Alcohol/week: 1.0 standard drink    Types:  1 Standard drinks or equivalent per week    Comment: 05/13/21 none, on occassion. Last beer at midnight tonight   Drug use: No   Sexual activity: Yes    Partners: Female    Birth control/protection: Condom  Other Topics Concern   Not on file  Social History Narrative   Family: Engaged May 2019- wedding considering 2020 but concerned with covid (before she starts PA school). Adopted Son of [place] of replacements limited (also patient of Dr. Yong Channel). 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   Social Determinants of Health   Financial Resource Strain: Not on file  Food Insecurity: Not on file  Transportation Needs: Not on file  Physical Activity: Not on file  Stress: Not on file  Social Connections: Not on file  Intimate Partner Violence: Not on file    Family History:  Family History  Adopted: Yes  Problem Relation Age of Onset   Other Mother        adopted   Alcohol abuse Other        biological parents per adopted father    Medications:   Current Outpatient Medications on File Prior to Visit  Medication Sig Dispense Refill   amoxicillin (AMOXIL) 500 MG tablet Take 4 tablet by mouth 1 hour before dental procedures. 4 tablet 1   aspirin EC 81 MG tablet Take 1 tablet (81 mg total) by mouth daily. Swallow whole.     atorvastatin (LIPITOR) 80 MG tablet TAKE 1 TABLET(80 MG) BY MOUTH DAILY 90 tablet 0   lisdexamfetamine (VYVANSE) 30 MG capsule Take 1 capsule (30 mg total) by mouth daily before breakfast. 30 capsule 0   lisdexamfetamine (VYVANSE) 30 MG capsule Take 1 capsule (30 mg total) by mouth daily before breakfast. 30 capsule 0   [START ON 11/17/2021] lisdexamfetamine (VYVANSE) 30  MG capsule Take 1 capsule (30 mg total) by mouth daily before breakfast. 30 capsule 0   Melatonin 10 MG TABS Take 10 mg by mouth at bedtime as needed (sleep).     No current facility-administered medications on file prior to visit.    Allergies:  No Known Allergies    OBJECTIVE:  Physical Exam  There were no vitals filed for this visit.  There is no height or weight on file to calculate BMI. No results found.  General: well developed, well nourished, pleasant young Caucasian male, seated, in no evident distress Head: head normocephalic and atraumatic.   Neck: supple with no carotid or  supraclavicular bruits Cardiovascular: regular rate and rhythm, no murmurs Musculoskeletal: no deformity Skin:  no rash/petichiae Vascular:  Normal pulses all extremities   Neurologic Exam Mental Status: Awake and fully alert. Oriented to place and time. Recent and remote memory intact. Attention span, concentration and fund of knowledge appropriate. Mood and affect appropriate.  Cranial Nerves: Pupils equal, briskly reactive to light. Extraocular movements full without nystagmus. Visual fields full to confrontation. Hearing intact. Facial sensation intact. Face, tongue, palate moves normally and symmetrically.  Motor: Normal bulk and tone. Normal strength in all tested extremity muscles Sensory.: intact to touch , pinprick , position and vibratory sensation.  Coordination: Rapid alternating movements normal in all extremities. Finger-to-nose and heel-to-shin performed accurately bilaterally. Gait and Station: Arises from chair without difficulty. Stance is normal. Gait demonstrates normal stride length and balance without use of assistive device. Tandem walk and heel toe without difficulty.  Romberg negative Reflexes: 1+ and symmetric. Toes downgoing.          ASSESSMENT: Carlos Jacobs is a 26 y.o. year old male with recent scattered L MCA infarcts on 123456, cardioembolic  likely due to PFO s/p closure 05/02/2021.  Vascular risk factors include HLD, PFO, nicotine use (vaping), and hx of tetralogy of Fallot s/p repair as an infant.      PLAN:  L MCA strokes :  Residual deficit: Recovered very well with subjective decreased right hand dexterity.  Unable to appreciate deficit on exam - provided work letter to return back to work Continue aspirin 81 mg daily  and atorvastatin 80 mg daily for secondary stroke prevention.   Discussed secondary stroke prevention measures and importance of close PCP follow up for aggressive stroke risk factor management. I have gone over the pathophysiology of stroke, warning signs and symptoms, risk factors and their management in some detail with instructions to go to the closest emergency room for symptoms of concern. No medications are managed by this office PFO: ROPE score 9. S/p closure 05/02/2021 by Dr. Burt Knack without complication. Completed 33mo DAPT. Plans for cards f/u 05/2022 HLD: LDL goal <70. LDL 70 (05/2021) down from 147 on atorvastatin 80 mg daily.  Request ongoing monitoring and management by PCP    Follow up in 6 months or call earlier if needed   CC:  PCP: Marin Olp, MD    I spent 46 minutes of face-to-face and non-face-to-face time with patient and wife.  This included previsit chart review, lab review, study review, order entry, electronic health record documentation, patient education regarding prior stroke and etiology, secondary stroke prevention measures and aggressive stroke risk factor management, minimal residual deficits and return back to work and answered all questions to patient satisfaction   Frann Rider, AGNP-BC  [facility] [address] [place], [place] [address]  Phone [phone] Fax [phone] Note: This document was prepared with digital dictation and possible smart phrase technology. Any transcriptional errors that result from this process are  unintentional.

## 2021-12-08 ENCOUNTER — Ambulatory Visit: Payer: No Typology Code available for payment source | Admitting: Family Medicine

## 2021-12-12 NOTE — Progress Notes (Signed)
? ?Phone [phone] ?In person visit ?  ?Subjective:  ? ?Carlos Jacobs is a 26 y.o. year old very pleasant male patient who presents for/with See problem oriented charting ?Chief Complaint  ?Patient presents with  ? Follow-up  ?  Pt is here to f/u on ADD.  ? Hyperlipidemia  ?  Pt would like cholesterol checked.  ? Hyperglycemia  ?   Pt would like a1c checked as well as stroke follow up. ?  ? ? ?This visit occurred during the SARS-CoV-2 public health emergency.  Safety protocols were in place, including screening questions prior to the visit, additional usage of staff PPE, and extensive cleaning of exam room while observing appropriate contact time as indicated for disinfecting solutions.  ? ?Past Medical History-  ?Patient Active Problem List  ? Diagnosis Date Noted  ? PFO (patent foramen ovale) 05/02/2021  ?  Priority: High  ? History of cardioembolic cerebrovascular accident (CVA) 04/07/2021  ?  Priority: High  ? ADHD (attention deficit hyperactivity disorder)   ?  Priority: High  ? Tetralogy of Fallot   ?  Priority: High  ? Hyperglycemia 04/10/2021  ?  Priority: Medium   ? Hyperlipidemia 04/10/2021  ?  Priority: Medium   ? Asthma   ?  Priority: Medium   ? Migraines   ?  Priority: Medium   ? Depression   ?  Priority: Medium   ? Former smoker 05/02/2018  ?  Priority: Low  ? Ganglion cyst of dorsum of left wrist 05/02/2018  ?  Priority: Low  ? Acne 05/01/2015  ?  Priority: Low  ? ? ?Medications- reviewed and updated ?Current Outpatient Medications  ?Medication Sig Dispense Refill  ? aspirin EC 81 MG tablet Take 1 tablet (81 mg total) by mouth daily. Swallow whole.    ? atorvastatin (LIPITOR) 80 MG tablet TAKE 1 TABLET(80 MG) BY MOUTH DAILY 90 tablet 0  ? Melatonin 10 MG TABS Take 10 mg by mouth at bedtime as needed (sleep).    ? lisdexamfetamine (VYVANSE) 30 MG capsule Take 1 capsule (30 mg total) by mouth daily before breakfast. 90 capsule 0  ? ?No current facility-administered medications for  this visit.  ? ?  ?Objective:  ?BP 102/62   Pulse 84   Temp 98.6 ?F (37 ?C)   Ht 5\' 10"  (1.778 m)   Wt 275 lb 12.8 oz (125.1 kg)   SpO2 98%   BMI 39.57 kg/m?  ?Gen: NAD, resting comfortably ?CV: RRR no murmurs rubs or gallops ?Lungs: CTAB no crackles, wheeze, rhonchi ?Ext: no edema ?Skin: warm, dry ?  ? ?Assessment and Plan  ? ?#social update- , handyman, and renovations. Challenging running a business.  ? ?#history of CVA due to PFO. repair with Dr. Recruitment consultant in 2022- he is on aspirin long term. Finished 6 months of plavix. Remains on statin.  ? ?#history of tetralogy of repair- s/p repair- no planned follow up from [facility] peds ?  ?# ADD ?S:control: mild poor control off meds- he was started on 20 mg but titrated up to 30 mg and this has worked really well for him.  ?-  in past up to 60mg  Vyvanse with psychiatry. ?- remains off nicotine ?- patient discussed with cardiology and they were ok restarting. Neurology also did not have concern per patient.   ?A/P: ADD well controlled. Wants 90 day supply at least this visit- has been covered in the past ?- no anxiety with vyvanse ? ?Controlled substance contract: will  sign today ?NCCSRS/PDMP reviewed: yes- low risk rx ?Original diagnosis: with psychiatry- last visit in 2018 - we have records on file  ? UDS 05/19/21 low risk ? ? #hyperlipidemia ?S: Medication:atorvastatin 80mg   ?Lab Results  ?Component Value Date  ? CHOL 119 05/13/2021  ? HDL 34 (L) 05/13/2021  ? LDLCALC 70 05/13/2021  ? TRIG 73 05/13/2021  ? CHOLHDL 3.5 05/13/2021  ? A/P: well controlled with LDL at 70- continue current meds ? ?# Hyperglycemia ?S:  Medication: None ?Exercise and diet- weight stable from last visit - about to start back with tennis. Just started myfitnesspal- working with wife. Eating in and healthy more. Doing gym 3 days a week ?Lab Results  ?Component Value Date  ? HGBA1C 5.6 04/07/2021  ? HGBA1C 5.8 (H) 04/06/2021  ? A/P: prediabetes noted in past- due for a1c recheck-  update today- discussed Encouraged need for healthy eating, regular exercise, weight loss.  ? ? ?Health Maintenance Due  ?Topic Date Due  ? Hepatitis C Screening  Never done  ? COVID-19 Vaccine (3 - Pfizer risk series) 02/07/2020  ? ?Recommended follow up: No follow-ups on file. ?Future Appointments  ?Date Time Provider Department Center  ?05/26/2022  8:40 AM 05/28/2022, Durene Cal, MD [facility]  ? ? ?Lab/Order associations: ?  ICD-10-CM   ?1. Attention deficit hyperactivity disorder (ADHD), predominantly inattentive type  F90.0   ?  ?2. Hyperglycemia  R73.9 Hemoglobin A1c  ?  ?3. Hyperlipidemia, unspecified hyperlipidemia type  E78.5 Comprehensive metabolic panel  ?  LDL cholesterol, direct  ?  CBC  ?  ? ? ?Meds ordered this encounter  ?Medications  ? lisdexamfetamine (VYVANSE) 30 MG capsule  ?  Sig: Take 1 capsule (30 mg total) by mouth daily before breakfast.  ?  Dispense:  90 capsule  ?  Refill:  0  ? ?I,Jada Bradford,acting as a scribe for Aldine Contes, MD.,have documented all relevant documentation on the behalf of Tana Conch, MD,as directed by  Tana Conch, MD while in the presence of Tana Conch, MD. ? ?I, Tana Conch, MD, have reviewed all documentation for this visit. The documentation on 12/19/21 for the exam, diagnosis, procedures, and orders are all accurate and complete. ? ? ?Return precautions advised.  ?02/18/22, MD ? ? ?

## 2021-12-19 ENCOUNTER — Encounter: Payer: Self-pay | Admitting: Family Medicine

## 2021-12-19 ENCOUNTER — Ambulatory Visit (INDEPENDENT_AMBULATORY_CARE_PROVIDER_SITE_OTHER): Payer: No Typology Code available for payment source | Admitting: Family Medicine

## 2021-12-19 VITALS — BP 102/62 | HR 84 | Temp 98.6°F | Ht 70.0 in | Wt 275.8 lb

## 2021-12-19 DIAGNOSIS — R739 Hyperglycemia, unspecified: Secondary | ICD-10-CM

## 2021-12-19 DIAGNOSIS — Z1159 Encounter for screening for other viral diseases: Secondary | ICD-10-CM | POA: Diagnosis not present

## 2021-12-19 DIAGNOSIS — E785 Hyperlipidemia, unspecified: Secondary | ICD-10-CM | POA: Diagnosis not present

## 2021-12-19 DIAGNOSIS — F900 Attention-deficit hyperactivity disorder, predominantly inattentive type: Secondary | ICD-10-CM | POA: Diagnosis not present

## 2021-12-19 MED ORDER — LISDEXAMFETAMINE DIMESYLATE 30 MG PO CAPS: 30.0000 mg | ORAL_CAPSULE | Freq: Every day | ORAL | 0 refills | Status: DC

## 2021-12-19 NOTE — Patient Instructions (Addendum)
Call us or mychart [place] date of 3rd covid vaccine. ? ?Team please have him complete controlled substance contract ? ?Please stop by lab before you go ?If you have mychart- we will send your results within 3 business days of [place] receiving them.  ?If you do not have mychart- we will call you about results within 5 business days of [place] receiving them.  ?*please also note that you will see labs on mychart as soon as they post. I will later go in and write notes on them- will say "notes from Dr. Durene Cal"  ? ?Recommended follow up: Return in about 6 months (around 06/21/2022) for physical or sooner if needed.  ?

## 2021-12-22 LAB — HEPATITIS C ANTIBODY
Hepatitis C Ab: NONREACTIVE
SIGNAL TO CUT-OFF: <0.02 (ref <1.00)

## 2021-12-22 LAB — COMPREHENSIVE METABOLIC PANEL
AG Ratio: 1.5 (calc) (ref 1.0–2.5)
ALT: 21 U/L (ref 9–46)
AST: 20 U/L (ref 10–40)
Albumin: 4.4 g/dL (ref 3.6–5.1)
Alkaline phosphatase (APISO): 63 U/L (ref 36–130)
BUN: 17 mg/dL (ref 7–25)
CO2: 27 mmol/L (ref 20–32)
Calcium: 9.2 mg/dL (ref 8.6–10.3)
Chloride: 103 mmol/L (ref 98–110)
Creat: 1.18 mg/dL (ref 0.60–1.24)
Globulin: 3 g/dL (calc) (ref 1.9–3.7)
Glucose, Bld: 140 mg/dL — ABNORMAL HIGH (ref 65–99)
Potassium: 3.7 mmol/L (ref 3.5–5.3)
Sodium: 139 mmol/L (ref 135–146)
Total Bilirubin: 0.6 mg/dL (ref 0.2–1.2)
Total Protein: 7.4 g/dL (ref 6.1–8.1)

## 2021-12-22 LAB — HEMOGLOBIN A1C
Hgb A1c MFr Bld: 5.7 % of total Hgb — ABNORMAL HIGH (ref <5.7)
Mean Plasma Glucose: 117 mg/dL
eAG (mmol/L): 6.5 mmol/L

## 2021-12-22 LAB — CBC
HCT: 46.8 % (ref 38.5–50.0)
Hemoglobin: 15.7 g/dL (ref 13.2–17.1)
MCH: 28.1 pg (ref 27.0–33.0)
MCHC: 33.5 g/dL (ref 32.0–36.0)
MCV: 83.7 fL (ref 80.0–100.0)
MPV: 9.9 fL (ref 7.5–12.5)
Platelets: 277 10*3/uL (ref 140–400)
RBC: 5.59 10*6/uL (ref 4.20–5.80)
RDW: 12.2 % (ref 11.0–15.0)
WBC: 7.6 10*3/uL (ref 3.8–10.8)

## 2021-12-22 LAB — LDL CHOLESTEROL, DIRECT: Direct LDL: 52 mg/dL (ref <100)

## 2022-01-03 ENCOUNTER — Other Ambulatory Visit: Payer: Self-pay | Admitting: Family Medicine

## 2022-02-17 ENCOUNTER — Other Ambulatory Visit: Payer: Self-pay | Admitting: Family Medicine

## 2022-03-26 NOTE — Telephone Encounter (Signed)
Left message to call back to arrange 1 year PFO echo/OV.

## 2022-04-01 NOTE — Telephone Encounter (Signed)
Attempted to call the patient to schedule 1 year visits. VM was full- unable to leave message.  Left message on his wife's VM to call back.

## 2022-04-07 ENCOUNTER — Other Ambulatory Visit: Payer: Self-pay | Admitting: Cardiology

## 2022-04-07 DIAGNOSIS — Q2112 Patent foramen ovale: Secondary | ICD-10-CM

## 2022-05-12 ENCOUNTER — Other Ambulatory Visit: Payer: Self-pay | Admitting: Family Medicine

## 2022-05-12 NOTE — Telephone Encounter (Signed)
..   Encourage patient to contact the pharmacy for refills or they can request refills through [facility]  LAST APPOINTMENT DATE:  12/19/21  NEXT APPOINTMENT DATE: 05/26/22  MEDICATION: lisdexamfetamine (VYVANSE) 30 MG capsule    Is the patient out of medication? Yes  PHARMACY: [facility] at [address] [place], [place], [place] [address]  Patient is asking that Vyvanse be sent to the pharmacy above from now on.

## 2022-05-12 NOTE — Progress Notes (Signed)
[facility]   MULTIDISCIPLINARY HEART VALVE CLINIC                                     Cardiology Office Note:    Date:  05/18/2022   ID:  Ulla Gallo, DOB 04/30/1996, MRN [id number]  PCP:  Shelva Majestic, MD  [facility] Cardiologist:  None  [facility] Electrophysiologist:  None   Referring MD: Shelva Majestic, MD   Chief Complaint  Patient presents with   Follow-up    1 year PFO    History of Present Illness:    Carlos Jacobs is a 26 y.o. male with a hx of ADHD, morbid obesity, tetrology de fallot s/p remote repair, previous tobacco abuse now quit. HLD, cryptogenic CVA and PFO s/p percutaneous patent foramen ovale closure (05/02/21) who presents for 1 year s/p PFO follow up.    The patient underwent Tetrology of Fallot repair at [facility] at 2 months old. He had no limitation throughout his youth and played sports throughout middle and high school with no problems. He was told the surgical repair was 'incredibly succesful' and he was released from routine follow-up at age 37.    He then presented to the emergency department on 04/05/21 due to right hand weakness. CT head showed no acute abnormality. CT angiogram of the head and neck showed no stenosis. MRI of the brain showed scattered left MCA distribution strokes. TEE showed a EF of 55 to 60% with no residual VSD but did show PFO present. LDL was 147. The patient was started on high intensity statin therapy.  A1c was 5.8 which makes the patient prediabetic.  Patient was counseled on lifestyle modifications.     He was evaluated by Dr. Excell Seltzer and underwent successful transcatheter PFO closure using a 25 mm Amplatzer PFO occluder on 05/02/21. Post op limited echo was normal with an EF of 50-55%. He was discharged on aspirin and Plavix.   Today he is here with his wife and states that he has been doing well with no new neuro changes. Can still tell he has very mild residual  weakness when he writes but is otherwise fully recovered. He denies chest pain, palpitations, SOB, LE edema, dizziness, or syncope. Limited echo with bubble with no shunting on my review however will wait for final read to communicate this with the patient.   Past Medical History:  Diagnosis Date   ADHD (attention deficit hyperactivity disorder)    vyvanse 60mg    Asthma    exercise induced   Depression    anxiety/history when on 70mg  vyvanse- improved with decreased dose.    Migraines    Stroke [facility])    Tetralogy of Fallot    s/p repair. [place] [facility] saw cardiology until about age 39-told return 7 years.     Past Surgical History:  Procedure Laterality Date   GANGLION CYST EXCISION  2014   PATENT FORAMEN OVALE(PFO) CLOSURE N/A 05/02/2021   Procedure: PATENT FORAMEN OVALE (PFO) CLOSURE;  Surgeon: [facility], MD;  Location: [facility] INVASIVE CV LAB;  Service: Cardiovascular;  Laterality: N/A;   TEE WITHOUT CARDIOVERSION N/A 04/07/2021   Procedure: TRANSESOPHAGEAL ECHOCARDIOGRAM (TEE);  Surgeon: [facility], MD;  Location: [facility] ENDOSCOPY;  Service: Cardiovascular;  Laterality: N/A;   TETRALOGY OF FALLOT REPAIR      Current Medications: Current Meds  Medication Sig   aspirin EC  81 MG tablet Take 1 tablet (81 mg total) by mouth daily. Swallow whole.   atorvastatin (LIPITOR) 80 MG tablet TAKE 1 TABLET(80 MG) BY MOUTH DAILY   lisdexamfetamine (VYVANSE) 30 MG capsule Take 1 capsule (30 mg total) by mouth daily.   Melatonin 10 MG TABS Take 10 mg by mouth at bedtime as needed (sleep).     Allergies:   Patient has no known allergies.   Social History   Socioeconomic History   Marital status: Married    Spouse name: Plooy   Number of children: Not on file   Years of education: Not on file   Highest education level: Not on file  Occupational History   Not on file  Tobacco Use   Smoking status: Former    Packs/day: 0.50    Types: Cigarettes    Start date: 10/12/2013    Quit date:  02/09/2018    Years since quitting: 4.2   Smokeless tobacco: Never  Vaping Use   Vaping Use: Never used  Substance and Sexual Activity   Alcohol use: Not Currently    Alcohol/week: 1.0 standard drink of alcohol    Types: 1 Standard drinks or equivalent per week    Comment: 05/13/21 none, on occassion. Last beer at midnight tonight   Drug use: No   Sexual activity: Yes    Partners: Female    Birth control/protection: Condom  Other Topics Concern   Not on file  Social History Narrative   Family: Engaged May 2019- wedding considering 2020 but concerned with covid (before she starts PA school). Adopted Son of [place] of replacements limited (also patient of Dr. Durene Cal). 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   Social Determinants of Health   Financial Resource Strain: Not on file  Food Insecurity: Not on file  Transportation Needs: Not on file  Physical Activity: Not on file  Stress: Not on file  Social Connections: Not on file    Family History: The patient's family history includes Alcohol abuse in an other family member; Heart attack in his father; Other in his mother; Stroke in his father. He was adopted.  ROS:   Please see the history of present illness.    All other systems reviewed and are negative.  EKGs/Labs/Other Studies Reviewed:    The following studies were reviewed today:   05/02/21 PATENT FORAMEN OVALE (PFO) CLOSURE    Conclusion   Successful transcatheter PFO closure using a 25 mm Amplatzer PFO occluder device with intracardiac echo and fluoroscopic guidance   Recommendations   Antiplatelet/Anticoag Recommend uninterrupted dual antiplatelet therapy with  Aspirin 81mg  daily and Clopidogrel 75mg  daily for 6 months.  _______________________   Limited Echo 05/02/21 IMPRESSIONS   1. Limited study; Amplatzer PFO occluder noted with no residual shunt.   2. Left ventricular ejection fraction, by estimation, is 50 to 55%. The  left ventricle has low normal function. The left ventricle has no regional  wall motion abnormalities.   3. The mitral valve is normal in structure.   4. Aortic dilatation noted. There is borderline dilatation of the aortic  root, measuring  38 mm. There is borderline dilatation of the ascending  aorta, measuring 39 mm.   EKG:  EKG is not ordered today.    Recent Labs: 12/19/2021: ALT 21; BUN 17; Creat 1.18; Hemoglobin 15.7; Platelets 277; Potassium 3.7; Sodium 139   Recent Lipid Panel    Component Value Date/Time   CHOL 119 05/13/2021 0843   TRIG 73 05/13/2021 0843   HDL 34 (L) 05/13/2021 0843   CHOLHDL 3.5 05/13/2021 0843   CHOLHDL 5.6 04/06/2021 0105   VLDL 28 04/06/2021 0105   LDLCALC 70 05/13/2021 0843   LDLDIRECT 52 12/19/2021 1640   Physical Exam:    VS:  BP 100/70 (BP Location: Left Arm, Patient Position: Sitting, Cuff Size: Normal)   Pulse 76   Ht 5\' 10"  (1.778 m)   Wt 274 lb (124.3 kg)   SpO2 98%   BMI 39.31 kg/m     Wt Readings from Last 3 Encounters:  05/18/22 274 lb (124.3 kg)  12/19/21 275 lb 12.8 oz (125.1 kg)  06/05/21 275 lb 9.6 oz (125 kg)    General: Well developed, well nourished, NAD Lungs:Clear to ausculation bilaterally. Breathing is unlabored. Cardiovascular: RRR with S1 S2. No murmurs Extremities: No edema. Neuro: Alert and oriented. No focal deficits. No facial asymmetry. MAE spontaneously. Psych: Responds to questions appropriately with normal affect.    ASSESSMENT/PLAN:    PFO s/p percutaneous closure: Doing well post PFO closure. Continue ASA 81mg  QD. No need for SBE. Limited echo with bubble with no shunting however waiting for final review. Will have him follow with Dr.  06/07/21 in one year given his cardiac surgery hx.    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 Follow with Dr.   Obesity: Body mass index is 39.31kg/m. Works [employer] and is very active. Continue lifestyle modifications.    Tobacco abuse: Continues with abstinence.     Medication Adjustments/Labs and Tests Ordered: Current medicines are reviewed at length with the patient today.  Concerns regarding medicines are outlined above.  No orders of the defined types were placed in this encounter.  No orders of the defined types were placed in this encounter.   Patient Instructions  Medication Instructions:  Your physician recommends that you continue on your current medications as directed. Please refer to the Current Medication list given to you today.  *If you need a refill on your cardiac medications before your next appointment, please call your pharmacy*   Lab Work: NONE If you have labs (blood work) drawn today and your tests are completely normal, you will receive your results only by: MyChart Message (if you have MyChart) OR A paper copy in the mail If you have any lab test that is abnormal or we need to change your treatment, we will call you to review the results.   Testing/Procedures: NONE   Follow-Up: At [facility], you and your health needs are our priority.  As part of our continuing mission to provide you with exceptional heart care, we have created designated Provider Care Teams.  These Care Teams include your primary Cardiologist (physician) and Advanced Practice Providers (APPs -  Physician Assistants and Nurse Practitioners) who all work together to provide you with the care you need, when you need it.  We recommend signing up for the patient portal called "MyChart".  Sign up information is provided on this After Visit Summary.  MyChart is used to connect with patients for Virtual Visits (Telemedicine).  Patients are able  to view lab/test results, encounter notes, upcoming appointments, etc.  Non-urgent messages can be sent to your provider as well.   To learn more about what you can do with MyChart, go to [email or web address].    Your next appointment:   1 year(s)  The format for your next appointment:   In Person  Provider:   DR. Jens Som   Important Information About Sugar         Signed, Georgie Chard, NP  05/18/2022 12:55 PM    [facility]

## 2022-05-13 MED ORDER — LISDEXAMFETAMINE DIMESYLATE 30 MG PO CAPS: 30.0000 mg | ORAL_CAPSULE | Freq: Every day | ORAL | 0 refills | Status: DC

## 2022-05-13 NOTE — Telephone Encounter (Signed)
Refills sent to Dr. Durene Cal for approval.

## 2022-05-17 ENCOUNTER — Other Ambulatory Visit: Payer: Self-pay | Admitting: Family Medicine

## 2022-05-18 ENCOUNTER — Ambulatory Visit (INDEPENDENT_AMBULATORY_CARE_PROVIDER_SITE_OTHER): Payer: BC Managed Care – PPO | Admitting: Cardiology

## 2022-05-18 ENCOUNTER — Ambulatory Visit (HOSPITAL_COMMUNITY): Payer: BC Managed Care – PPO | Attending: Cardiovascular Disease

## 2022-05-18 VITALS — BP 100/70 | HR 76 | Ht 70.0 in | Wt 274.0 lb

## 2022-05-18 DIAGNOSIS — Z8774 Personal history of (corrected) congenital malformations of heart and circulatory system: Secondary | ICD-10-CM

## 2022-05-18 DIAGNOSIS — Q2112 Patent foramen ovale: Secondary | ICD-10-CM | POA: Insufficient documentation

## 2022-05-18 DIAGNOSIS — Z720 Tobacco use: Secondary | ICD-10-CM | POA: Diagnosis not present

## 2022-05-18 DIAGNOSIS — Q213 Tetralogy of Fallot: Secondary | ICD-10-CM

## 2022-05-18 DIAGNOSIS — E785 Hyperlipidemia, unspecified: Secondary | ICD-10-CM

## 2022-05-18 NOTE — Patient Instructions (Signed)
Medication Instructions:  Your physician recommends that you continue on your current medications as directed. Please refer to the Current Medication list given to you today.  *If you need a refill on your cardiac medications before your next appointment, please call your pharmacy*   Lab Work: NONE If you have labs (blood work) drawn today and your tests are completely normal, you will receive your results only by: MyChart Message (if you have MyChart) OR A paper copy in the mail If you have any lab test that is abnormal or we need to change your treatment, we will call you to review the results.   Testing/Procedures: NONE   Follow-Up: At [facility], you and your health needs are our priority.  As part of our continuing mission to provide you with exceptional heart care, we have created designated Provider Care Teams.  These Care Teams include your primary Cardiologist (physician) and Advanced Practice Providers (APPs -  Physician Assistants and Nurse Practitioners) who all work together to provide you with the care you need, when you need it.  We recommend signing up for the patient portal called "MyChart".  Sign up information is provided on this After Visit Summary.  MyChart is used to connect with patients for Virtual Visits (Telemedicine).  Patients are able to view lab/test results, encounter notes, upcoming appointments, etc.  Non-urgent messages can be sent to your provider as well.   To learn more about what you can do with MyChart, go to [email or web address].    Your next appointment:   1 year(s)  The format for your next appointment:   In Person  Provider:   DR. Jens Som   Important Information About Sugar

## 2022-05-19 LAB — ECHOCARDIOGRAM LIMITED BUBBLE STUDY
Area-P 1/2: 3.16 cm2
S' Lateral: 3.15 cm

## 2022-05-26 ENCOUNTER — Encounter: Payer: Self-pay | Admitting: Family Medicine

## 2022-05-26 ENCOUNTER — Ambulatory Visit (INDEPENDENT_AMBULATORY_CARE_PROVIDER_SITE_OTHER): Payer: BC Managed Care – PPO | Admitting: Family Medicine

## 2022-05-26 VITALS — BP 100/70 | HR 65 | Temp 98.3°F | Ht 70.0 in | Wt 271.2 lb

## 2022-05-26 DIAGNOSIS — Z87891 Personal history of nicotine dependence: Secondary | ICD-10-CM | POA: Diagnosis not present

## 2022-05-26 DIAGNOSIS — R0683 Snoring: Secondary | ICD-10-CM | POA: Diagnosis not present

## 2022-05-26 DIAGNOSIS — Z0000 Encounter for general adult medical examination without abnormal findings: Secondary | ICD-10-CM | POA: Diagnosis not present

## 2022-05-26 DIAGNOSIS — R739 Hyperglycemia, unspecified: Secondary | ICD-10-CM

## 2022-05-26 DIAGNOSIS — E785 Hyperlipidemia, unspecified: Secondary | ICD-10-CM | POA: Diagnosis not present

## 2022-05-26 DIAGNOSIS — G478 Other sleep disorders: Secondary | ICD-10-CM

## 2022-05-26 LAB — COMPREHENSIVE METABOLIC PANEL
ALT: 22 U/L (ref 0–53)
AST: 19 U/L (ref 0–37)
Albumin: 4.7 g/dL (ref 3.5–5.2)
Alkaline Phosphatase: 69 U/L (ref 39–117)
BUN: 17 mg/dL (ref 6–23)
CO2: 27 mEq/L (ref 19–32)
Calcium: 9.5 mg/dL (ref 8.4–10.5)
Chloride: 106 mEq/L (ref 96–112)
Creatinine, Ser: 1.06 mg/dL (ref 0.40–1.50)
GFR: 96.97 mL/min (ref >60.00)
Glucose, Bld: 97 mg/dL (ref 70–99)
Potassium: 5.2 mEq/L — ABNORMAL HIGH (ref 3.5–5.1)
Sodium: 143 mEq/L (ref 135–145)
Total Bilirubin: 0.6 mg/dL (ref 0.2–1.2)
Total Protein: 7.3 g/dL (ref 6.0–8.3)

## 2022-05-26 LAB — CBC WITH DIFFERENTIAL/PLATELET
Basophils Absolute: 0 10*3/uL (ref 0.0–0.1)
Basophils Relative: 0.6 % (ref 0.0–3.0)
Eosinophils Absolute: 0.1 10*3/uL (ref 0.0–0.7)
Eosinophils Relative: 1 % (ref 0.0–5.0)
HCT: 46.5 % (ref 39.0–52.0)
Hemoglobin: 15.4 g/dL (ref 13.0–17.0)
Lymphocytes Relative: 42 % (ref 12.0–46.0)
Lymphs Abs: 3 10*3/uL (ref 0.7–4.0)
MCHC: 33 g/dL (ref 30.0–36.0)
MCV: 84.4 fl (ref 78.0–100.0)
Monocytes Absolute: 0.6 10*3/uL (ref 0.1–1.0)
Monocytes Relative: 8.9 % (ref 3.0–12.0)
Neutro Abs: 3.4 10*3/uL (ref 1.4–7.7)
Neutrophils Relative %: 47.5 % (ref 43.0–77.0)
Platelets: 251 10*3/uL (ref 150.0–400.0)
RBC: 5.51 Mil/uL (ref 4.22–5.81)
RDW: 13.2 % (ref 11.5–15.5)
WBC: 7.1 10*3/uL (ref 4.0–10.5)

## 2022-05-26 LAB — HEMOGLOBIN A1C: Hgb A1c MFr Bld: 5.9 % (ref 4.6–6.5)

## 2022-05-26 LAB — POC URINALSYSI DIPSTICK (AUTOMATED)
Bilirubin, UA: NEGATIVE
Blood, UA: NEGATIVE
Glucose, UA: POSITIVE — AB
Ketones, UA: NEGATIVE
Leukocytes, UA: NEGATIVE
Nitrite, UA: NEGATIVE
Protein, UA: NEGATIVE
Spec Grav, UA: >=1.03 — AB (ref 1.010–1.025)
Urobilinogen, UA: 0.2 E.U./dL
pH, UA: 5 (ref 5.0–8.0)

## 2022-05-26 LAB — LIPID PANEL
Cholesterol: 123 mg/dL (ref 0–200)
HDL: 40.8 mg/dL (ref >39.00)
LDL Cholesterol: 70 mg/dL (ref 0–99)
NonHDL: 82.09
Total CHOL/HDL Ratio: 3
Triglycerides: 60 mg/dL (ref 0.0–149.0)
VLDL: 12 mg/dL (ref 0.0–40.0)

## 2022-05-26 LAB — TSH: TSH: 2.31 u[IU]/mL (ref 0.35–5.50)

## 2022-05-26 NOTE — Patient Instructions (Addendum)
Flu shot- we should have these available within a month or two but please let us know if you get at outside pharmacy  Last Tdap 2018- if going to be close to newborn a lot consider update at pharmacy because they can give cash pay price  We will call you within two weeks about your referral to pulmonology. If you do not hear within 2 weeks, give [place] a call.    Please stop by lab before you go If you have mychart- we will send your results within 3 business days of [place] receiving them.  If you do not have mychart- we will call you about results within 5 business days of [place] receiving them.  *please also note that you will see labs on mychart as soon as they post. I will later go in and write notes on them- will say "notes from Dr. Durene Cal"   Recommended follow up: Return in about 6 months (around 11/26/2022) for followup or sooner if needed.Schedule b4 you leave.

## 2022-05-26 NOTE — Progress Notes (Signed)
Phone: [phone]    Subjective:  Patient presents today for their annual physical. Chief complaint-noted.   See problem oriented charting- ROS- full  review of systems was completed and negative  except for: heavy snoring and un refreshed sleep  The following were reviewed and entered/updated in epic: Past Medical History:  Diagnosis Date   ADHD (attention deficit hyperactivity disorder)    vyvanse 6m   Asthma    exercise induced   Depression    anxiety/history when on 750mvyvanse- improved with decreased dose.    Migraines    [facility]   Tetralogy of Fallot    s/p repair. [facility] saw cardiology until about age 26-toldeturn 7 years.    Patient Active Problem List   Diagnosis Date Noted   PFO (patent foramen ovale) 05/02/2021    Priority: High   History of cardioembolic cerebrovascular accident (CVA) 04/07/2021    Priority: High   ADHD (attention deficit hyperactivity disorder)     Priority: High   Tetralogy of Fallot     Priority: High   Hyperglycemia 04/10/2021    Priority: Medium    Hyperlipidemia 04/10/2021    Priority: Medium    Asthma     Priority: Medium    Migraines     Priority: Medium    Depression     Priority: Medium    Former smoker 05/02/2018    Priority: Low   Ganglion cyst of dorsum of left wrist 05/02/2018    Priority: Low   Acne 05/01/2015    Priority: Low   Past Surgical History:  Procedure Laterality Date   GANGLION CYST EXCISION  2014   PATENT FORAMEN OVALE(PFO) CLOSURE N/A 05/02/2021   Procedure: PATENT FORAMEN OVALE (PFO) CLOSURE;  Surgeon: CoSherren MochaMD;  [facility];  Service: Cardiovascular;  Laterality: N/A;   TEE WITHOUT CARDIOVERSION N/A 04/07/2021   Procedure: TRANSESOPHAGEAL ECHOCARDIOGRAM (TEE);  Surgeon: CrLelon PerlaMD;  [facility];  Service: Cardiovascular;  Laterality: N/A;   TETRALOGY OF FALLOT REPAIR      Family History  Adopted: Yes  Problem Relation Age of  Onset   Other Mother        adopted   Heart attack Father        age [id number] cause for both   Stroke Father        only 1240ears older than patient   Alcohol abuse Other        biological parents per adopted father    Medications- reviewed and updated Current Outpatient Medications  Medication Sig Dispense Refill   aspirin EC 81 MG tablet Take 1 tablet (81 mg total) by mouth daily. Swallow whole.     atorvastatin (LIPITOR) 80 MG tablet TAKE 1 TABLET(80 MG) BY MOUTH DAILY 90 tablet 0   lisdexamfetamine (VYVANSE) 30 MG capsule Take 1 capsule (30 mg total) by mouth daily. 30 capsule 0   Melatonin 10 MG TABS Take 10 mg by mouth at bedtime as needed (sleep).     No current facility-administered medications for this visit.    Allergies-reviewed and updated No Known Allergies  Social History   Social History Narrative   Family: Engaged May 2019- wedding considering 2020 but concerned with covid (before she starts PA school). Adopted Son of [place] Pigeonf replacements limited (also patient of Dr. 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      Objective:  BP 100/70   Pulse 65   Temp 98.3 F (36.8 C)   Ht _0  (1.778 m)   Wt 271 lb 3.2 oz (123 kg)   SpO2 96%   BMI 38.91 kg/m  Gen: NAD, resting comfortably HEENT: Mucous membranes are moist. Oropharynx normal Neck: no obvious thyromegaly- slightly questioned CV: RRR no murmurs rubs or gallops Lungs: CTAB no crackles, wheeze, rhonchi Abdomen: soft/nontender/nondistended/normal bowel sounds. No rebound or guarding.  Ext: no edema Skin: warm, dry Neuro: grossly normal, moves all extremities, PERRLA     Assessment and  Plan:  26 y.o. male presenting for annual physical.  Health Maintenance counseling: 1. Anticipatory guidance: Patient counseled regarding regular dental exams -q6 months, eye exams - as needed if glasses strength changes- minimum rx for nighttime driving,  avoiding smoking and second hand smoke , limiting alcohol to 2 beverages per day- once every week or two, no illicit drugs.   2. Risk factor reduction:  Advised patient of need for regular exercise and diet rich and fruits and vegetables to reduce risk of heart attack and stroke.  Exercise- walking and active with work.  Diet/weight management-eat out once a week. Trying to eat reasonably healthy at home. Even when he was doing door dash nightly was not gaining weight. Active at work and trying to do walks- he didn't like the gym. Calorie counting when younger but too restrictive.  Wt Readings from Last 3 Encounters:  05/26/22 271 lb 3.2 oz (123 kg)  05/18/22 274 lb (124.3 kg)  12/19/21 275 lb 12.8 oz (125.1 kg)   3. Immunizations/screenings/ancillary studies- let us know when you get the flu shot Immunization History  Administered Date(s) Administered   DTaP 03/24/1996, 06/01/1996, 07/20/1996, 06/25/1997, 09/07/2000   HPV Quadrivalent 05/16/2012, 07/20/2012, 11/29/2012   Hepatitis A 05/04/2011, 05/16/2012   Hepatitis B [birth year]96, 02/22/1996, 12/11/1996   HiB (PRP-OMP) 03/24/1996, 06/01/1996, 07/20/1996, 05/15/1997   IPV 03/24/1996, 06/01/1996, 06/25/1997, 09/07/2000   Influenza Nasal 07/31/2009, 08/12/2011, 07/20/2012   Influenza,inj,Quad PF,6+ Mos 08/10/2016, 08/06/2017   Influenza-Unspecified 08/27/2007, 08/08/2008, 09/21/2008, 09/04/2016   MMR 06/15/1997, 11/03/2002   Meningococcal Conjugate 05/04/2011   Meningococcal Polysaccharide 05/25/2013   PFIZER(Purple Top)SARS-COV-2 Vaccination 12/15/2019, 01/10/2020, 09/04/2020   Pneumococcal Polysaccharide-23 10/13/2014   Tdap 01/24/2007, 04/30/2017   Varicella 01/24/1997, 05/25/2013     4. Prostate cancer screening- no known history- adopted, start at age 68  5. Colon cancer screening - no known history- adopted, start at age 6 6. Skin cancer screening/prevention- advised regular sunscreen use. Denies worrisome, changing, or new skin lesions- other than irritation around waistband.  7. Testicular cancer screening- advised monthly self exams  8. STD screening- patient opts out as monogamous  9. 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 Burt Knack in 2020-just had cardiology visit earlier in [place] for aspirin 81 mg to continue.  Plan for follow-up with Dr. Stanford Breed in 1 year.  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 Stanford Breed  #hyperlipidemia S: Medication:Atorvastatin 80 mg Lab Results  Component Value Date   CHOL 119 05/13/2021   HDL 34 (L) 05/13/2021   LDLCALC 70 05/13/2021   LDLDIRECT 52 12/19/2021   TRIG 73 05/13/2021   CHOLHDL 3.5 05/13/2021   A/P: Excellent control last check-update lipid panel with labs today   # Hyperglycemia/insulin resistance/prediabetes S:  Medication: none Exercise and diet-very active with his business-weight forward pounds down from last visit Lab Results  Component Value Date   HGBA1C 5.7 (H) 12/19/2021   HGBA1C 5.6 04/07/2021   HGBA1C 5.8 (H) 04/06/2021    A/P: needs to work on weight loss= update a1c   # ADD S:control: reasonable medication: Vyvanse 30 mg- hast to take after meal Controlled substance contract: signed at last visit 01/02/22 NCCSRS/PDMP reviewed: low risk fill pattern- picked up 10days ago for first time since march  Original diagnosis: with psychiatry- last visit 2018- we have  records on file-Kim Dansie MD [facility], [place] doctor in my basket confirmed A/P: Controlled. Continue current medications.    #concern OSA_ heavy snoring and feels unrefreshed in am- refer for slepe apnea testing - refer to pulmonology for sleep studies   Recommended follow up: Return in about 6 months (around 11/26/2022) for followup or sooner if needed.Schedule b4 you leave. Future Appointments  Date Time Provider [place]  05/28/2023  9:00 AM Marin Olp, MD [facility]   Lab/Order associations: fasting   ICD-10-CM   1. Preventative health care  Z00.00     2. Hyperlipidemia, unspecified hyperlipidemia type  E78.5     3. Hyperglycemia  R73.9     4. Snoring  R06.83     5. Unrefreshed by sleep  G47.8     6. Former smoker  Z87.891       No orders of the defined types were placed in this encounter.   Return precautions advised.   Garret Reddish, MD

## 2022-06-05 ENCOUNTER — Encounter: Payer: Self-pay | Admitting: Primary Care

## 2022-06-05 ENCOUNTER — Ambulatory Visit (INDEPENDENT_AMBULATORY_CARE_PROVIDER_SITE_OTHER): Payer: BC Managed Care – PPO | Admitting: Primary Care

## 2022-06-05 VITALS — BP 116/72 | HR 94 | Temp 98.1°F | Ht 70.0 in | Wt 267.6 lb

## 2022-06-05 DIAGNOSIS — R0683 Snoring: Secondary | ICD-10-CM

## 2022-06-05 NOTE — Patient Instructions (Addendum)
Sleep apnea is defined as period of 10 seconds or longer when you stop breathing at night. This can happen multiple times a night. Dx sleep apnea is when this occurs more than 5 times an hour.    Mild OSA 5-15 apneic events an hour Moderate OSA 15-30 apneic events an hour Severe OSA > 30 apneic events an hour   Untreated sleep apnea puts you at higher risk for cardiac arrhythmias, pulmonary HTN, stroke and diabetes   Treatment options include weight loss, side sleeping position, oral appliance, CPAP therapy or referral to ENT for possible surgical options    Recommendations: Focus on side sleeping position or elevate head of bed 30 degrees with wedge pillow  Continue to work on weight loss efforts as you have been  Do not drive if experiencing excessive daytime sleepiness of fatigue    Orders: Home sleep study re: loud snoring    Follow-up: Please call to schedule follow-up 1-2 weeks after completing home sleep study to review results and treatment if needed    Sleep Apnea Sleep apnea affects breathing during sleep. It causes breathing to stop for 10 seconds or more, or to become shallow. People with sleep apnea usually snore loudly. It can also increase the risk of: Heart attack. Stroke. Being very overweight (obese). Diabetes. Heart failure. Irregular heartbeat. High blood pressure. The goal of treatment is to help you breathe normally again. What are the causes?  The most common cause of this condition is a collapsed or blocked airway. There are three kinds of sleep apnea: Obstructive sleep apnea. This is caused by a blocked or collapsed airway. Central sleep apnea. This happens when the brain does not send the right signals to the muscles that control breathing. Mixed sleep apnea. This is a combination of obstructive and central sleep apnea. What increases the risk? Being overweight. Smoking. Having a small airway. Being older. Being male. Drinking alcohol. Taking  medicines to calm yourself (sedatives or tranquilizers). Having family members with the condition. Having a tongue or tonsils that are larger than normal. What are the signs or symptoms? Trouble staying asleep. Loud snoring. Headaches in the morning. Waking up gasping. Dry mouth or sore throat in the morning. Being sleepy or tired during the day. If you are sleepy or tired during the day, you may also: Not be able to focus your mind (concentrate). Forget things. Get angry a lot and have mood swings. Feel sad (depressed). Have changes in your personality. Have less interest in sex, if you are male. Be unable to have an erection, if you are male. How is this treated?  Sleeping on your side. Using a medicine to get rid of mucus in your nose (decongestant). Avoiding the use of alcohol, medicines to help you relax, or certain pain medicines (narcotics). Losing weight, if needed. Changing your diet. Quitting smoking. Using a machine to open your airway while you sleep, such as: An oral appliance. This is a mouthpiece that shifts your lower jaw forward. A CPAP device. This device blows air through a mask when you breathe out (exhale). An EPAP device. This has valves that you put in each nostril. A BIPAP device. This device blows air through a mask when you breathe in (inhale) and breathe out. Having surgery if other treatments do not work. Follow these instructions at home: Lifestyle Make changes that your doctor recommends. Eat a healthy diet. Lose weight if needed. Avoid alcohol, medicines to help you relax, and some pain medicines. Do not  smoke or use any products that contain nicotine or tobacco. If you need help quitting, ask your doctor. General instructions Take over-the-counter and prescription medicines only as told by your doctor. If you were given a machine to use while you sleep, use it only as told by your doctor. If you are having surgery, make sure to tell your  doctor you have sleep apnea. You may need to bring your device with you. Keep all follow-up visits. Contact a doctor if: The machine that you were given to use during sleep bothers you or does not seem to be working. You do not get better. You get worse. Get help right away if: Your chest hurts. You have trouble breathing in enough air. You have an uncomfortable feeling in your back, arms, or stomach. You have trouble talking. One side of your body feels weak. A part of your face is hanging down. These symptoms may be an emergency. Get help right away. Call your local emergency services (911 in the [place] Do not wait to see if the symptoms will go away. Do not drive yourself to the hospital. Summary This condition affects breathing during sleep. The most common cause is a collapsed or blocked airway. The goal of treatment is to help you breathe normally while you sleep. This information is not intended to replace advice given to you by your health care provider. Make sure you discuss any questions you have with your health care provider. Document Revised: 05/07/2021 Document Reviewed: 09/06/2020 Elsevier Patient Education  2023 [employer].

## 2022-06-05 NOTE — Assessment & Plan Note (Addendum)
-   Patient has symptoms of loud snoring and nonrestorative sleep.  Hx stroke d/t PFO. Epworth score 4.  BMI 38. Concern patient could have underlying obstructive sleep apnea, needs home sleep study to evaluate. Reviewed risks of untreated sleep apnea including cardiac arrhythmias, pulmonary hypertension, stroke and diabetes.  Discussed treatment options including weight loss, oral appliance, CPAP therapy or referral to ENT for possible surgical options.  Continue to encourage patient work on weight loss efforts.  Advised against driving if experiencing excessive daytime sleepiness or fatigue.  Follow-up 1 to 2 weeks after sleep study to review results and treatment options if needed.

## 2022-06-05 NOTE — Progress Notes (Signed)
_0  ID: Carlos Jacobs, male    DOB: [birth year], 26 y.o.   MRN: [id number]  Chief Complaint  Patient presents with   Consult    Snoring and sleep talking and un refreshed sleep x 8 months.    Referring provider: Marin Olp, MD  HPI: 26 year old male, former smoker. PMH CVA, PFO, tetralogy of fallot, asthma, ADHD.  06/05/2022 Patient presents today for sleep consult. He has hx CVA from PFO which was repaired last year. He follows with cardiology. No significant residuals from stroke. He has regained strength right hand. He has not been sleeping well the last 8-9 months. He has symptoms of snoring, waking up gasping for breath and non-restorative sleep. He does not feel rested after getting 6 or 10 hours of sleep. He has been told by his wife that his snoring is very loud. 2 to 3 years ago he did experience episodes of night terrors and sleepwalking. He takes melatonin to help him sleep. Typical bedtime is 10:30 pm. He falls asleep on his side but will end up on his back. He wakes up 1-2 times a night. He will wake up around 2am. No issues falling back asleep. Starts his day between 8:30-9:30am. Weight has fluctuated last couple of years but trending downwards.  Denies symptoms of narcolepsy, cataplexy.  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) Do you currently wear CPAP- No Do you current wear oxygen- No Epworth- 4  No Known Allergies  Immunization History  Administered Date(s) Administered   DTaP 03/24/1996, 06/01/1996, 07/20/1996, 06/25/1997, 09/07/2000   HPV Quadrivalent 05/16/2012, 07/20/2012, 11/29/2012   Hepatitis A 05/04/2011, 05/16/2012   Hepatitis B  1996/09/21, 02/22/1996, 12/11/1996   HiB (PRP-OMP) 03/24/1996, 06/01/1996, 07/20/1996, 05/15/1997   IPV 03/24/1996, 06/01/1996, 06/25/1997, 09/07/2000   Influenza Nasal 07/31/2009, 08/12/2011, 07/20/2012   Influenza,inj,Quad PF,6+ Mos 08/10/2016, 08/06/2017   Influenza-Unspecified 08/27/2007, 08/08/2008, 09/21/2008, 09/04/2016   MMR 06/15/1997, 11/03/2002   Meningococcal Conjugate 05/04/2011   Meningococcal Polysaccharide 05/25/2013   PFIZER(Purple Top)SARS-COV-2 Vaccination 12/15/2019, 01/10/2020, 09/04/2020   Pneumococcal Polysaccharide-23 10/13/2014   Tdap 01/24/2007, 04/30/2017   Varicella 01/24/1997, 05/25/2013    Past Medical History:  Diagnosis Date   ADHD (attention deficit hyperactivity disorder)    vyvanse 33m   Asthma    exercise induced   Depression    anxiety/history when on 765mvyvanse- improved with decreased dose.    [facility]igraines    [facility]   Tetralogy of Fallot    s/p repair. [facility] saw cardiology until about age 26-toldeturn 7 years.     Tobacco History: Social History   Tobacco Use  Smoking Status Former   Packs/day: 2.00   Types: Cigarettes   Start date: 10/12/2013   Quit date: 02/09/2018   Years since quitting: 4.3   Passive exposure: Never  Smokeless Tobacco Never   Counseling given: Not Answered   Outpatient Medications Prior to Visit  Medication Sig Dispense Refill   aspirin EC 81 MG tablet Take 1 tablet (81 mg total) by mouth daily. Swallow whole.     atorvastatin (LIPITOR) 80 MG tablet TAKE 1 TABLET(80 MG) BY MOUTH DAILY 90 tablet 0   lisdexamfetamine (VYVANSE) 30 MG capsule Take 1 capsule (30 mg total)  by mouth daily. 30 capsule 0   Melatonin 10 MG TABS Take 10 mg by mouth at bedtime as needed (sleep).     No facility-administered medications prior to visit.   Review of Systems  Review of Systems  Constitutional: Negative.   HENT: Negative.    Respiratory:  Positive for apnea.   Cardiovascular: Negative.     Physical  Exam  BP 116/72 (BP Location: Left Arm, Patient Position: Sitting, Cuff Size: Large)   Pulse 94   Temp 98.1 F (36.7 C) (Oral)   Ht _0  (1.778 m)   Wt 267 lb 9.6 oz (121.4 kg)   SpO2 98%   BMI 38.40 kg/m  Physical Exam Constitutional:      General: He is not in acute distress.    Appearance: Normal appearance. He is not ill-appearing.  HENT:     Nose:     Comments: Deviated nasal septum     Mouth/Throat:     Mouth: Mucous membranes are moist.     Pharynx: Oropharynx is clear.  Neck:     Comments: Large neck Cardiovascular:     Rate and Rhythm: Normal rate and regular rhythm.  Pulmonary:     Effort: Pulmonary effort is normal.     Breath sounds: Normal breath sounds. No wheezing, rhonchi or rales.  Musculoskeletal:        General: Normal range of motion.     Cervical back: Normal range of motion and neck supple.  Lymphadenopathy:     Cervical: No cervical adenopathy.  Skin:    General: Skin is warm and dry.  Neurological:     General: No focal deficit present.     Mental Status: He is alert and oriented to person, place, and time. Mental status is at baseline.  Psychiatric:        Mood and Affect: Mood normal.        Behavior: Behavior normal.        Thought Content: Thought content normal.        Judgment: Judgment normal.      Lab Results:  CBC    Component Value Date/Time   WBC 7.1 05/26/2022 0955   RBC 5.51 05/26/2022 0955   HGB 15.4 05/26/2022 0955   HCT 46.5 05/26/2022 0955   PLT 251.0 05/26/2022 0955   MCV 84.4 05/26/2022 0955   MCH 28.1 12/19/2021 1640   MCHC 33.0 05/26/2022 0955   RDW 13.2 05/26/2022 0955   LYMPHSABS 3.0 05/26/2022 0955   MONOABS 0.6 05/26/2022 0955   EOSABS 0.1 05/26/2022 0955   BASOSABS 0.0 05/26/2022 0955    BMET    Component Value Date/Time   NA 143 05/26/2022 0955   K 5.2 (H) 05/26/2022 0955   CL 106 05/26/2022 0955   CO2 27 05/26/2022 0955   GLUCOSE 97 05/26/2022 0955   BUN 17 05/26/2022 0955   CREATININE  1.06 05/26/2022 0955   CREATININE 1.18 12/19/2021 1640   CALCIUM 9.5 05/26/2022 0955   GFRNONAA >60 04/07/2021 0519    BNP No results found for: "BNP"  ProBNP No results found for: "PROBNP"  Imaging: ECHOCARDIOGRAM LIMITED BUBBLE STUDY  Result Date: 05/19/2022    ECHOCARDIOGRAM LIMITED REPORT   Patient Name:   Carlos Jacobs Date of Exam: 05/18/2022 Medical Rec #:  [id number]             Height:       70.0 in Accession #:    [id number]  Weight:       275.8 lb Date of Birth:  08-13-1996             BSA:          2.393 m Patient Age:    26 years              BP:           130/79 mmHg Patient Gender: M                     HR:           66 bpm. Exam Location:  [address] Procedure: Limited Echo, Cardiac Doppler and Limited Color Doppler Indications:     Q21.1 PFO  History:         Patient has prior history of Echocardiogram examinations, most                  recent 05/02/2021. Risk Factors:HLD. Tetralogy of fallot repair                  10 months old.  Sonographer:     Marygrace Drought RCS Referring Phys:  [place] Phys: Eleonore Chiquito MD IMPRESSIONS  1. S/p PFO closure. No residual shunt on bubble study. Agitated saline contrast bubble study was negative, with no evidence of any interatrial shunt.  2. Left ventricular ejection fraction, by estimation, is 60 to 65%. The left ventricle has normal function. The left ventricle has no regional wall motion abnormalities.  3. The mitral valve is grossly normal. No evidence of mitral valve regurgitation. No evidence of mitral stenosis.  4. The aortic valve is tricuspid. Aortic valve regurgitation is not visualized. No aortic stenosis is present. FINDINGS  Left Ventricle: Left ventricular ejection fraction, by estimation, is 60 to 65%. The left ventricle has normal function. The left ventricle has no regional wall motion abnormalities. The left ventricular internal cavity size was normal in size. There is  no left ventricular  hypertrophy. Left Atrium: Left atrial size was normal in size. Right Atrium: Right atrial size was normal in size. Mitral Valve: The mitral valve is grossly normal. No evidence of mitral valve stenosis. Tricuspid Valve: The tricuspid valve is grossly normal. Tricuspid valve regurgitation is not demonstrated. No evidence of tricuspid stenosis. Aortic Valve: The aortic valve is tricuspid. Aortic valve regurgitation is not visualized. No aortic stenosis is present. Pulmonic Valve: Pulmonic valve regurgitation is not visualized. No evidence of pulmonic stenosis. Aorta: The aortic root and ascending aorta are structurally normal, with no evidence of dilitation. IAS/Shunts: Agitated saline contrast was given intravenously to evaluate for intracardiac shunting. Agitated saline contrast bubble study was negative, with no evidence of any interatrial shunt. LEFT VENTRICLE PLAX 2D LVIDd:         4.60 cm LVIDs:         3.15 cm LV PW:         1.00 cm LV IVS:        1.00 cm LVOT diam:     2.00 cm LV SV:         74 LV SV Index:   31 LVOT Area:     3.14 cm  LEFT ATRIUM         Index LA diam:    3.30 cm 1.38 cm/m  AORTIC VALVE LVOT Vmax:   107.00 cm/s LVOT Vmean:  79.000 cm/s LVOT VTI:    0.235 m  AORTA Ao Root  diam: 3.80 cm Ao Asc diam:  3.30 cm MITRAL VALVE MV Area (PHT):             SHUNTS MV Decel Time:             Systemic VTI:  0.24 m MV E velocity: 86.50 cm/s  Systemic Diam: 2.00 cm MV A velocity: 64.50 cm/s MV E/A ratio:  1.34 Eleonore Chiquito MD Electronically signed by Eleonore Chiquito MD Signature Date/Time: 05/18/2022/2:37:15 PM    Final (Updated)      Assessment & Plan:   Loud snoring - Patient has symptoms of loud snoring and nonrestorative sleep.  Hx stroke d/t PFO. Epworth score 4.  BMI 38. Concern patient could have underlying obstructive sleep apnea, needs home sleep study to evaluate. Reviewed risks of untreated sleep apnea including cardiac arrhythmias, pulmonary hypertension, stroke and diabetes.  Discussed  treatment options including weight loss, oral appliance, CPAP therapy or referral to ENT for possible surgical options.  Continue to encourage patient work on weight loss efforts.  Advised against driving if experiencing excessive daytime sleepiness or fatigue.  Follow-up 1 to 2 weeks after sleep study to review results and treatment options if needed.   Martyn Ehrich, NP 06/05/2022

## 2022-06-07 NOTE — Progress Notes (Signed)
Reviewed and agree with assessment/plan.   Lillyonna Armstead, MD [facility] 06/07/2022, 7:28 AM Pager:  [phone]  

## 2022-06-14 IMAGING — MR MR HEAD WO/W CM
16 of 23 series · 29 of 48 positions shown · IV contrast (Gadavist)
Comparison: Prior CT from 04/05/2021.

CLINICAL DATA: Initial evaluation for acute neuro deficit, stroke
suspected.

EXAM:
MRI HEAD WITHOUT AND WITH CONTRAST
MRI CERVICAL SPINE WITHOUT AND WITH CONTRAST
TECHNIQUE: Multiplanar, multiecho pulse sequences of the brain and surrounding
structures, and cervical spine, to include the craniocervical
junction and cervicothoracic junction, were obtained without and
with intravenous contrast.
CONTRAST:  10mL GADAVIST GADOBUTROL 1 MMOL/ML IV SOLN

[Series 5: DWI · axial · 3.0mm · 0.88mm/px · z∈[-110,+30]mm · 4 of 96 slices shown (1 of 4)]
[im 1/96]
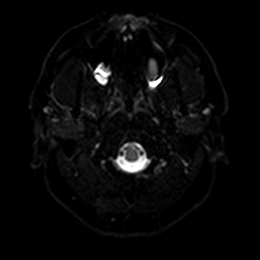
[im 32/96]
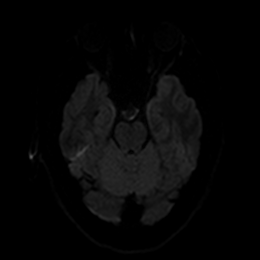
[im 64/96]
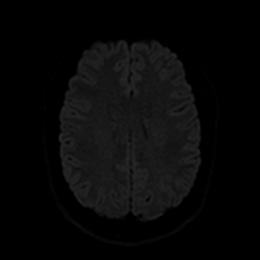
[im 96/96]
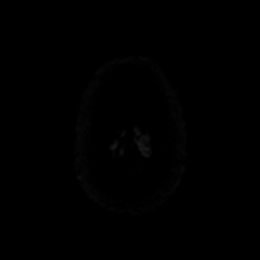

[Series 6: DWI · axial · 3.0mm · 0.88mm/px · z∈[-110,+30]mm · 2 of 48 slices shown (2 of 4)]
[im 1/48]
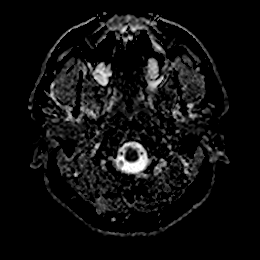
[im 48/48]
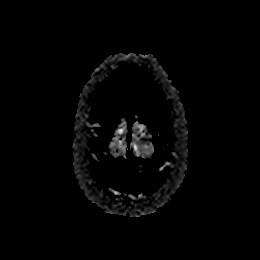

[Series 7: DWI · coronal · 4.0mm · 0.88mm/px · 4 of 68 slices shown (3 of 4)]
[im 1/68]
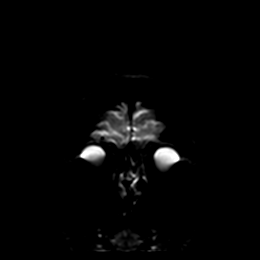
[im 23/68]
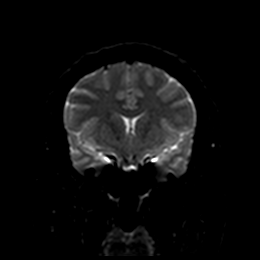
[im 45/68]
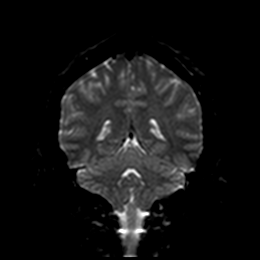
[im 68/68]
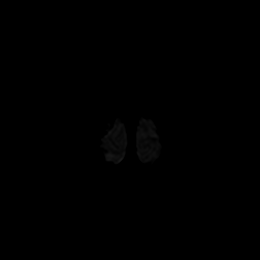

[Series 8: DWI · coronal · 4.0mm · 0.88mm/px · 2 of 34 slices shown (4 of 4)]
[im 1/34]
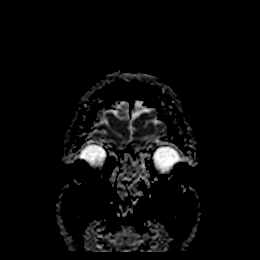
[im 34/34]
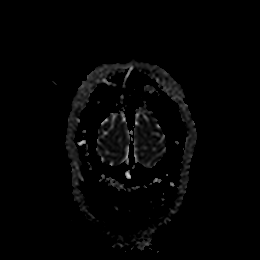

[Series 9: T1 · sagittal · 5.0mm · 0.75mm/px · 1 of 24 slices shown (1 of 3)]
[im 1/24]
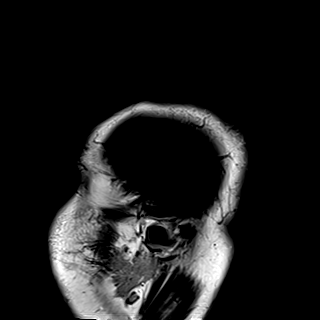

[Series 10: T2 · axial · 5.0mm · 0.72mm/px · 1 of 25 slices shown (1 of 4)]
[im 1/25]
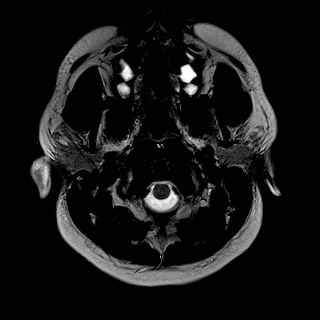

[Series 11: FLAIR · axial · 5.0mm · 0.45mm/px · 1 of 25 slices shown]
[im 1/25]
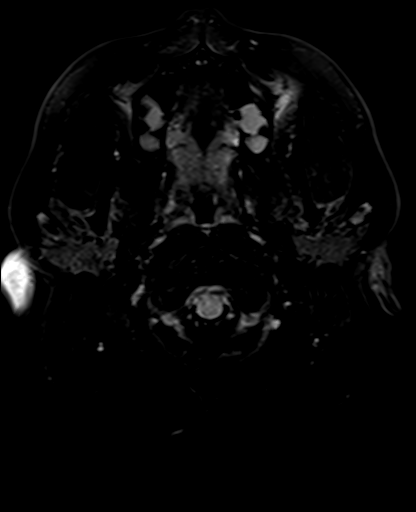

[Series 17: T2 · coronal · 5.0mm · 0.34mm/px · 2 of 29 slices shown (2 of 4)]
[im 1/29]
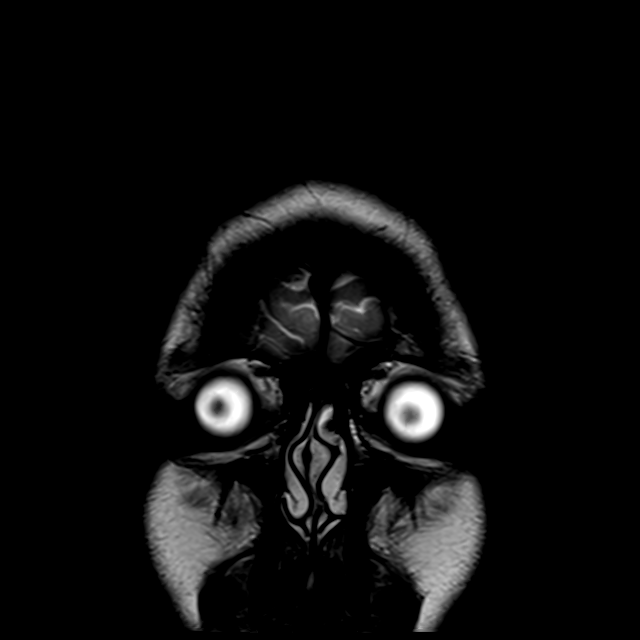
[im 29/29]
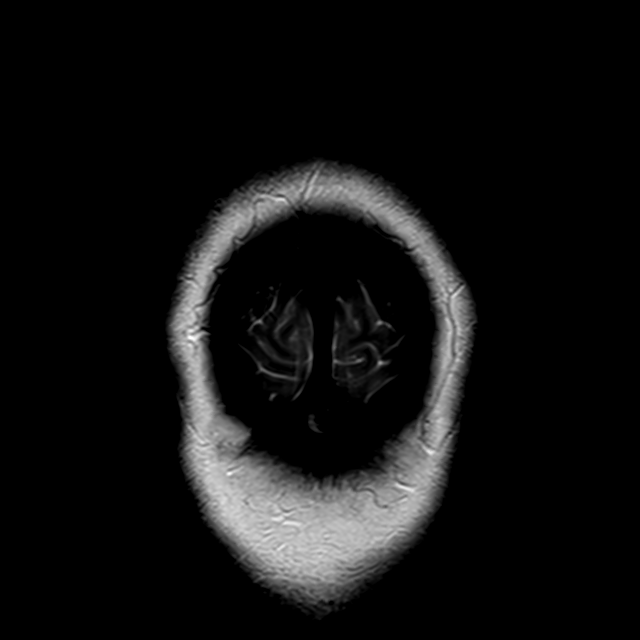

[Series 22: T2 · sagittal · 3.0mm · 0.69mm/px · 1 of 15 slices shown (3 of 4)]
[im 1/15]
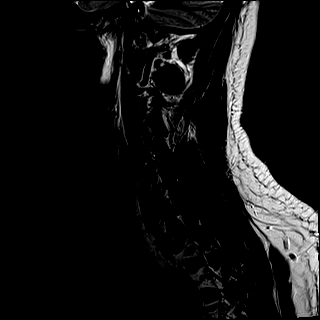

[Series 23: T1 · sagittal · 3.0mm · 0.69mm/px · 1 of 15 slices shown (2 of 3)]
[im 1/15]
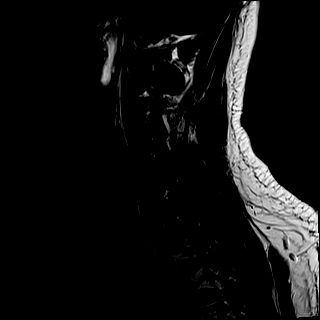

[Series 25: T2 · axial · 3.0mm · 0.66mm/px · z∈[-261,-156]mm · 2 of 34 slices shown (4 of 4)]
[im 1/34]
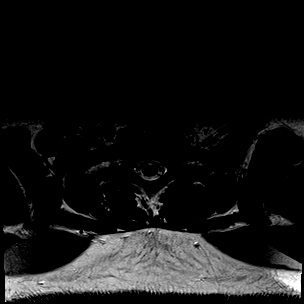
[im 34/34]
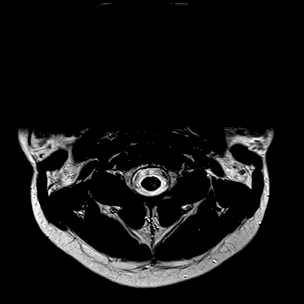

[Series 26: GRE · axial · 3.0mm · 0.39mm/px · z∈[-261,-156]mm · 2 of 34 slices shown]
[im 1/34]
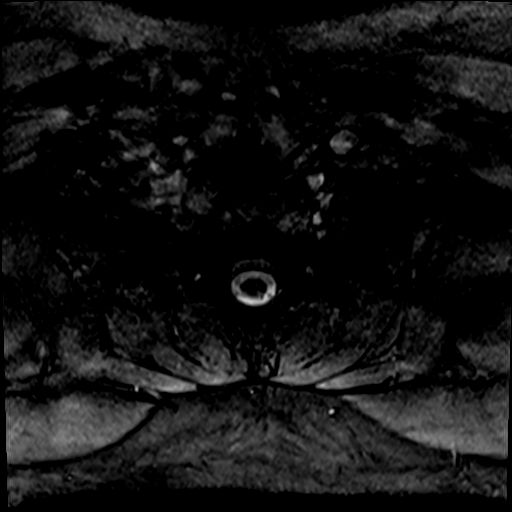
[im 34/34]
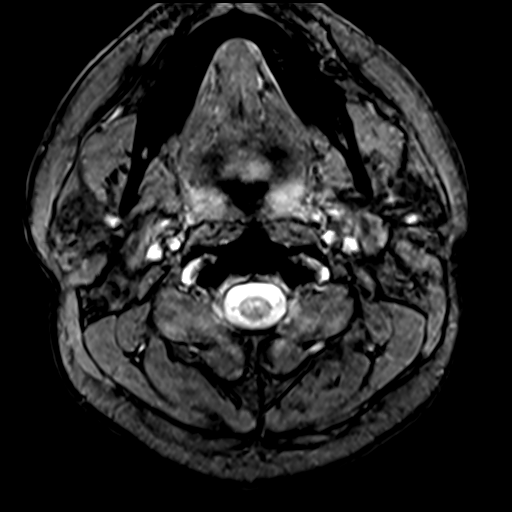

[Series 27: T1 · axial · 3.0mm · 0.39mm/px · z∈[-261,-156]mm · 2 of 34 slices shown (3 of 3)]
[im 1/34]
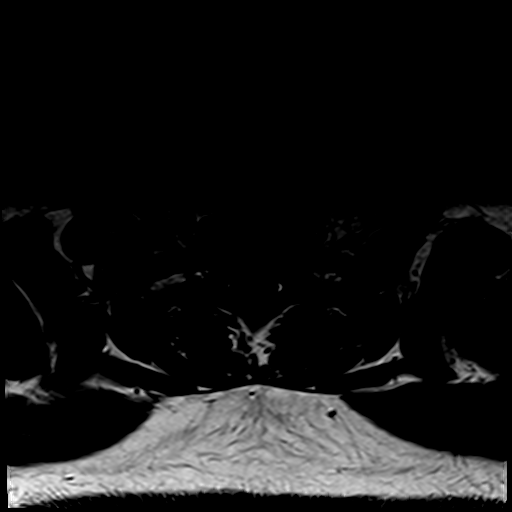
[im 34/34]
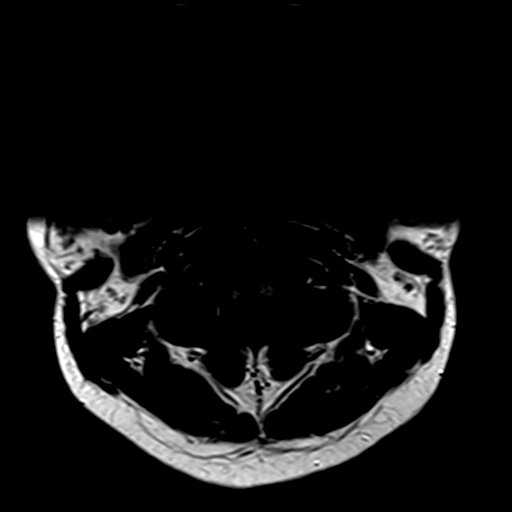

[Series 28: T1 fat-sat post-contrast · sagittal · 3.0mm · 0.43mm/px · 1 of 15 slices shown]
[im 1/15]
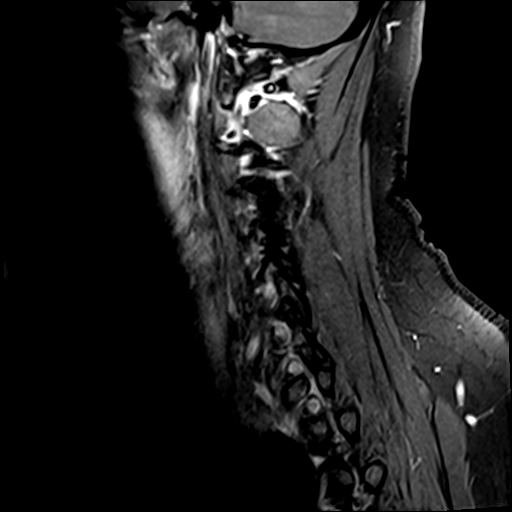

[Series 29: T1 post-contrast · axial · 3.0mm · 0.39mm/px · z∈[-261,-156]mm · 2 of 34 slices shown (1 of 2)]
[im 1/34]
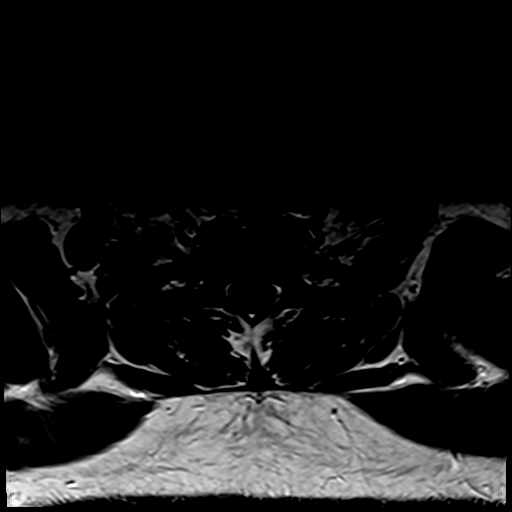
[im 34/34]
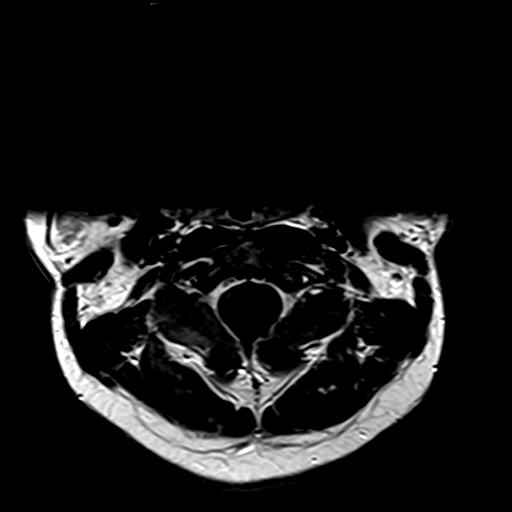

[Series 31: T1 post-contrast · coronal · 5.0mm · 0.34mm/px · 1 of 28 slices shown (2 of 2)]
[im 1/28]
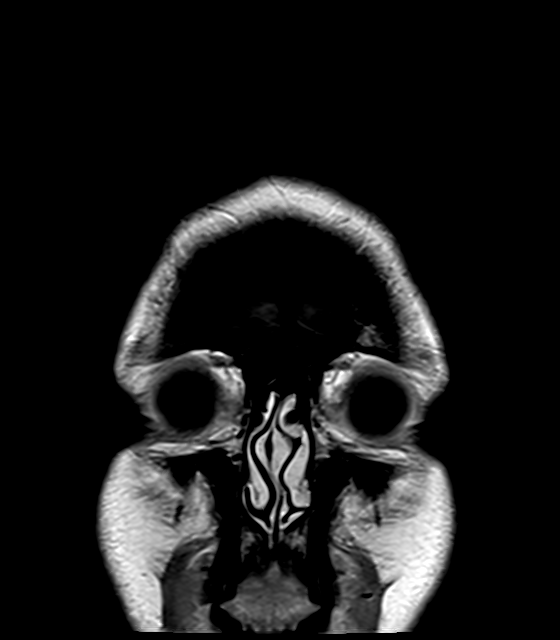

[29 of 48 positions shown; findings below may reference images not displayed]

FINDINGS: MRI HEAD FINDINGS

Brain: Cerebral volume within normal limits. There are scattered
patchy multifocal acute ischemic infarcts involving the cortical and
subcortical aspect of the left frontal, parietal, and temporal
lobes, left MCA distribution. Largest area of infarction seen at the
left frontal cortex and measures up to 1.9 cm. No associated
hemorrhage or mass effect. Mild patchy associated enhancement.
Findings likely embolic in nature.

Otherwise, the brain is normal in appearance. No other areas of
acute or subacute infarction. Gray-white matter differentiation
otherwise maintained. No other evidence for acute or chronic
intracranial hemorrhage. No mass lesion or midline shift. No
hydrocephalus or extra-axial fluid collection. Pituitary gland
suprasellar region normal. Midline structures intact. No other
abnormal enhancement.

Vascular: Major intracranial vascular flow voids are maintained.

Skull and upper cervical spine: Craniocervical junction within
normal limits. Bone marrow signal intensity normal. No scalp soft
tissue abnormality.

Sinuses/Orbits: Globes and orbital soft tissues within normal
limits. Scattered mucosal thickening noted within the maxillary
sinuses. Paranasal sinuses are otherwise clear. No significant
mastoid effusion. Inner ear structures grossly normal.

Other: None.

MRI CERVICAL SPINE FINDINGS

Alignment: Straightening of the normal cervical lordosis. No
listhesis.

Vertebrae: Vertebral body height maintained without acute or chronic
fracture. Bone marrow signal intensity within normal limits.
Probable approximate 1 cm atypical hemangioma partially visualize
within the T3 vertebral body. No other discrete or worrisome osseous
lesions. No abnormal marrow edema or enhancement.

Cord: Normal signal morphology.

Posterior Fossa, vertebral arteries, paraspinal tissues:
Unremarkable.

Disc levels: No significant disc pathology seen within the cervical
spine. No disc bulge or focal disc herniation. No significant canal
or foraminal stenosis or evidence for neural impingement.
IMPRESSION: MRI HEAD IMPRESSION:

1. Patchy multifocal acute ischemic nonhemorrhagic left MCA
distribution infarcts. No associated mass effect. Findings likely
embolic in nature.
2. Otherwise normal brain MRI.

MRI CERVICAL SPINE IMPRESSION:

Normal MRI of the cervical spine. No significant disc pathology,
stenosis or neural impingement.

## 2022-06-16 ENCOUNTER — Ambulatory Visit (INDEPENDENT_AMBULATORY_CARE_PROVIDER_SITE_OTHER): Payer: BC Managed Care – PPO | Admitting: Family Medicine

## 2022-06-16 ENCOUNTER — Encounter: Payer: Self-pay | Admitting: Family Medicine

## 2022-06-16 VITALS — BP 136/84 | HR 110 | Temp 97.8°F | Wt 264.4 lb

## 2022-06-16 DIAGNOSIS — L089 Local infection of the skin and subcutaneous tissue, unspecified: Secondary | ICD-10-CM | POA: Diagnosis not present

## 2022-06-16 DIAGNOSIS — S81852A Open bite, left lower leg, initial encounter: Secondary | ICD-10-CM

## 2022-06-16 MED ORDER — AMOXICILLIN-POT CLAVULANATE 875-125 MG PO TABS: 1.0000 | ORAL_TABLET | Freq: Two times a day (BID) | ORAL | 0 refills | Status: DC

## 2022-06-16 NOTE — Assessment & Plan Note (Signed)
There are 3 puncture wounds on the left lateral leg, Per report the animal was vaccinated for rabies, and patient is updating tetanus there does not appear to be any bony involvement Posterior wound there is possible concern that there is underlying infection, the anterior bite marks appear to be healing Discussed return and ED precaution regarding rabies and wound infection Trial Augmentin Follow-up as needed

## 2022-06-16 NOTE — Progress Notes (Signed)
Assessment/Plan:   Problem List Items Addressed This Visit       Other   Animal bite of left lower leg with infection - Primary    There are 3 puncture wounds on the left lateral leg, Per report the animal was vaccinated for rabies, and patient is updating tetanus there does not appear to be any bony involvement Posterior wound there is possible concern that there is underlying infection, the anterior bite marks appear to be healing Discussed return and ED precaution regarding rabies and wound infection Trial Augmentin Follow-up as needed      Relevant Medications   amoxicillin-clavulanate (AUGMENTIN) 875-125 MG tablet       Subjective:  HPI:  Carlos Jacobs is a 26 y.o. male who has Asthma; Migraines; Depression; ADHD (attention deficit hyperactivity disorder); Tetralogy of Fallot; Acne; Former smoker; Ganglion cyst of dorsum of left wrist; History of cardioembolic cerebrovascular accident (CVA); Hyperglycemia; Hyperlipidemia; PFO (patent foramen ovale); Loud snoring; and Animal bite of left lower leg with infection on their problem list..   He  has a past medical history of ADHD (attention deficit hyperactivity disorder), Asthma, Depression, Migraines, Stroke (HCC), and Tetralogy of Fallot.Marland Kitchen   He presents with chief complaint of Animal Bite (Patient was bitten by a dog on 06/12/22 and states that left calf is now turning red. Up to date on tdap.) .   Patient reports that he was bit by a dog 4 days ago.  This was on his left lower leg.  He reports that there were bite marks on the left lateral front and back of the leg.  The front to the reports are getting better.  The bite mark on the posterior aspect is still tender and draining.  Patient reports that the animal is up-to-date on rabies vaccination. Patient denies fevers or chills.  Patient is up-to-date on tetanus vaccination.  Past Surgical History:  Procedure Laterality Date   GANGLION CYST EXCISION  2014   PATENT  FORAMEN OVALE(PFO) CLOSURE N/A 05/02/2021   Procedure: PATENT FORAMEN OVALE (PFO) CLOSURE;  Surgeon: Tonny Bollman, MD;  Location: [facility] INVASIVE CV LAB;  Service: Cardiovascular;  Laterality: N/A;   TEE WITHOUT CARDIOVERSION N/A 04/07/2021   Procedure: TRANSESOPHAGEAL ECHOCARDIOGRAM (TEE);  Surgeon: Lewayne Bunting, MD;  Location: [facility] ENDOSCOPY;  Service: Cardiovascular;  Laterality: N/A;   TETRALOGY OF FALLOT REPAIR      Outpatient Medications Prior to Visit  Medication Sig Dispense Refill   aspirin EC 81 MG tablet Take 1 tablet (81 mg total) by mouth daily. Swallow whole.     atorvastatin (LIPITOR) 80 MG tablet TAKE 1 TABLET(80 MG) BY MOUTH DAILY 90 tablet 0   lisdexamfetamine (VYVANSE) 30 MG capsule Take 1 capsule (30 mg total) by mouth daily. 30 capsule 0   Melatonin 10 MG TABS Take 10 mg by mouth at bedtime as needed (sleep).     No facility-administered medications prior to visit.    Family History  Adopted: Yes  Problem Relation Age of Onset   Other Mother        adopted   Heart attack Father        age 60- unknown cause for both   Stroke Father        only 12 years older than patient   Alcohol abuse Other        biological parents per adopted father    Social History   Socioeconomic History   Marital status: Married    Spouse name: [place]  Number of children: Not on file   Years of education: Not on file   Highest education level: Not on file  Occupational History   Not on file  Tobacco Use   Smoking status: Former    Packs/day: 2.00    Types: Cigarettes    Start date: 10/12/2013    Quit date: 02/09/2018    Years since quitting: 4.3    Passive exposure: Never   Smokeless tobacco: Never  Vaping Use   Vaping Use: Never used  Substance and Sexual Activity   Alcohol use: Not Currently    Alcohol/week: 1.0 standard drink of alcohol    Types: 1 Standard drinks or equivalent per week    Comment: 05/13/21 none, on occassion. Last beer at midnight tonight   Drug use:  No   Sexual activity: Yes    Partners: Female    Birth control/protection: Condom  Other Topics Concern   Not on file  Social History Narrative   Family: Engaged May 2019- wedding considering 2020 but concerned with covid (before she starts PA school). Adopted Son of [place] of replacements limited (also patient of Dr. Durene Cal). 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   Social Determinants of Health   Financial Resource Strain: Not on file  Food Insecurity: Not on file  Transportation Needs: Not on file  Physical Activity: Not on file  Stress: Not on file  Social Connections: Not on file  Intimate Partner Violence: Not on file                                                                                                 Objective:  Physical Exam: BP 136/84 (BP Location: Left Arm, Patient Position: Sitting, Cuff Size: Large)   Pulse (!) 110   Temp 97.8 F (36.6 C) (Temporal)   Wt 264 lb 6.4 oz (119.9 kg)   SpO2 97%   BMI 37.94 kg/m    General: No acute distress. Awake and conversant.  Eyes: Normal conjunctiva, anicteric. Round symmetric pupils.  ENT: Hearing grossly intact. No nasal discharge.  Neck: Neck is supple. No masses or thyromegaly.  Respiratory: Respirations are non-labored. No auditory wheezing.  Skin: Warm. No rashes or ulcers.  There are 3 puncture wounds on the left lateral calf 2 on the ventral and 1 on the dorsal side.  They are about midway up the calf.  The 2 on the front are nontender, there is no drainage or surrounding erythema, there is just well-healing scab, the bites appear to be only located in the muscular aspect of the calf, there is not appear to be  any bony involvement with the medial shin, the posterior bite mark has some serosanguineous drainage, there is  from erythema, there is mild tenderness Psych: Alert and oriented. Cooperative, Appropriate mood and affect, Normal judgment.  CV: No cyanosis or JVD MSK: Normal ambulation. No clubbing  Neuro: Sensation and CN II-XII grossly normal.        Garner Nash, MD, MS

## 2022-07-06 ENCOUNTER — Encounter: Payer: Self-pay | Admitting: *Deleted

## 2022-07-15 ENCOUNTER — Other Ambulatory Visit: Payer: Self-pay | Admitting: Family Medicine

## 2022-07-15 NOTE — Telephone Encounter (Signed)
Pt states: -Will leave for a 2 week trip on 07/22/22. -Will run out of Rx while traveling. -He pays out of pocket for medication regularly, so insurance cost is not a concern. -he can call pharmacy for exact date of last fill, if needed. -[place] on [address] is preferred.    Pt asks: -Is there a way he can get Rx before he leaves/before the refill date?   Pt request: Response from PCP team via [place] or Phone Call.

## 2022-07-15 NOTE — Telephone Encounter (Signed)
Which rx is he needing?

## 2022-07-17 MED ORDER — LISDEXAMFETAMINE DIMESYLATE 30 MG PO CAPS
30.0000 mg | ORAL_CAPSULE | Freq: Every day | ORAL | 0 refills | Status: DC
Start: 2022-07-17 — End: 2022-08-28

## 2022-07-17 NOTE — Telephone Encounter (Signed)
Vyvanse

## 2022-07-17 NOTE — Telephone Encounter (Signed)
Rx sent to Dr. Yong Channel for early refill approval.

## 2022-08-27 ENCOUNTER — Encounter: Payer: Self-pay | Admitting: Family Medicine

## 2022-08-27 ENCOUNTER — Other Ambulatory Visit: Payer: Self-pay | Admitting: Family Medicine

## 2022-08-28 ENCOUNTER — Other Ambulatory Visit: Payer: Self-pay | Admitting: Family Medicine

## 2022-08-28 MED ORDER — LISDEXAMFETAMINE DIMESYLATE 30 MG PO CAPS: 30.0000 mg | ORAL_CAPSULE | Freq: Every day | ORAL | 0 refills | Status: DC

## 2022-08-28 MED ORDER — ATORVASTATIN CALCIUM 80 MG PO TABS: 80.0000 mg | ORAL_TABLET | Freq: Every day | ORAL | 1 refills | Status: DC

## 2022-08-28 NOTE — Telephone Encounter (Signed)
Pt requesting refill for Vyvanse 30 mg. Pt scheduled to see you 09/07/2022

## 2022-08-28 NOTE — Telephone Encounter (Signed)
  LAST APPOINTMENT DATE: 06/16/22  NEXT APPOINTMENT DATE: 09/07/22  MEDICATION: lisdexamfetamine (VYVANSE) 30 MG capsule  atorvastatin (LIPITOR) 80 MG tablet   Is the patient out of medication? Yes  [id number] [place] [place] - [address] [place] [address] Phone: [phone]  Fax: [phone]   Patient requests for [facility] listed above be his PRIMARY PHARMACY from now on.   Patient would also like to know if 90 supply of vyvanse could be sent in.

## 2022-09-07 ENCOUNTER — Ambulatory Visit: Payer: BC Managed Care – PPO | Admitting: Family Medicine

## 2022-09-07 ENCOUNTER — Encounter: Payer: Self-pay | Admitting: Family Medicine

## 2022-09-07 VITALS — BP 100/78 | HR 95 | Temp 97.4°F | Ht 70.0 in | Wt 266.0 lb

## 2022-09-07 DIAGNOSIS — J4520 Mild intermittent asthma, uncomplicated: Secondary | ICD-10-CM | POA: Diagnosis not present

## 2022-09-07 DIAGNOSIS — R7303 Prediabetes: Secondary | ICD-10-CM

## 2022-09-07 DIAGNOSIS — R739 Hyperglycemia, unspecified: Secondary | ICD-10-CM | POA: Diagnosis not present

## 2022-09-07 MED ORDER — SEMAGLUTIDE (1 MG/DOSE) 4 MG/3ML ~~LOC~~ SOPN: 1.0000 mg | PEN_INJECTOR | SUBCUTANEOUS | 4 refills | Status: DC

## 2022-09-07 MED ORDER — OZEMPIC (0.25 OR 0.5 MG/DOSE) 2 MG/3ML ~~LOC~~ SOPN: PEN_INJECTOR | SUBCUTANEOUS | 0 refills | Status: DC

## 2022-09-07 MED ORDER — OZEMPIC (0.25 OR 0.5 MG/DOSE) 2 MG/3ML ~~LOC~~ SOPN: PEN_INJECTOR | SUBCUTANEOUS | 0 refills | Status: AC

## 2022-09-07 MED ORDER — ALBUTEROL SULFATE HFA 108 (90 BASE) MCG/ACT IN AERS: 2.0000 | INHALATION_SPRAY | Freq: Four times a day (QID) | RESPIRATORY_TRACT | 2 refills | Status: DC | PRN

## 2022-09-07 NOTE — Addendum Note (Signed)
Addended by: Shelva Majestic on: 09/07/2022 12:18 PM   Modules accepted: Orders

## 2022-09-07 NOTE — Progress Notes (Signed)
Phone [phone] In person visit   Subjective:   Carlos Jacobs is a 26 y.o. year old very pleasant male patient who presents for/with See problem oriented charting Chief Complaint  Patient presents with   Follow-up    Pt would like to discuss ozempic.   Past Medical History-  Patient Active Problem List   Diagnosis Date Noted   PFO (patent foramen ovale) 05/02/2021    Priority: High   History of cardioembolic cerebrovascular accident (CVA) 04/07/2021    Priority: High   ADHD (attention deficit hyperactivity disorder)     Priority: High   Tetralogy of Fallot     Priority: High   Hyperglycemia 04/10/2021    Priority: Medium    Hyperlipidemia 04/10/2021    Priority: Medium    Asthma     Priority: Medium    Migraines     Priority: Medium    Depression     Priority: Medium    Former smoker 05/02/2018    Priority: Low   Ganglion cyst of dorsum of left wrist 05/02/2018    Priority: Low   Acne 05/01/2015    Priority: Low   Animal bite of left lower leg with infection 06/16/2022   Loud snoring 06/05/2022    Medications- reviewed and updated Current Outpatient Medications  Medication Sig Dispense Refill   albuterol (VENTOLIN HFA) 108 (90 Base) MCG/ACT inhaler Inhale 2 puffs into the lungs every 6 (six) hours as needed for wheezing or shortness of breath. 1 each 2   aspirin EC 81 MG tablet Take 1 tablet (81 mg total) by mouth daily. Swallow whole.     atorvastatin (LIPITOR) 80 MG tablet Take 1 tablet (80 mg total) by mouth daily. 90 tablet 1   lisdexamfetamine (VYVANSE) 30 MG capsule Take 1 capsule (30 mg total) by mouth daily. 30 capsule 0   Melatonin 10 MG TABS Take 10 mg by mouth at bedtime as needed (sleep).     Semaglutide, 1 MG/DOSE, 4 MG/3ML SOPN Inject 1 mg as directed once a week. After you finish starter dose 3 mL 4   Semaglutide,0.25 or 0.5MG /DOS, (OZEMPIC, 0.25 OR 0.5 MG/DOSE,) 2 MG/3ML SOPN Inject 0.25 mg into the skin once a week for 28 days, THEN 0.5  mg once a week for 28 days. 6 mL 0   No current facility-administered medications for this visit.     Objective:  BP 100/78   Pulse 95   Temp (!) 97.4 F (36.3 C)   Ht 5\' 10"  (1.778 m)   Wt 266 lb (120.7 kg)   SpO2 96%   BMI 38.17 kg/m  Gen: NAD, resting comfortably CV: RRR no murmurs rubs or gallops Lungs: CTAB no crackles, wheeze, rhonchi Ext: no edema Skin: warm, dry     Assessment and Plan   # Hyperglycemia/insulin resistance/prediabetes S:  Medication: none -has had some friends do well at home. Had gotten down to 248-250 on home scales in morning. Exercise and diet- MWF tennis 9-11:30 AM and Thursdays 1:30- 3:30 and gym on weekends. Started tennis 3 weeks ago.  -peak weight was 297 three years ago (had gotten up to 230s post college) and slowly working on this- mainly doing chicken, broccoli and cauliflower nightly, lunch is leftover or sandwhich- uses daves killer bread. Tried myfitnesspal and was down to 1600 calories but still hit plateau. Slight bump with holidays. Cut out juice.  -long term weight wants to be around 200 then come off medicine and maintain between 190-210 -has not  seen benefit from vyvanse Wt Readings from Last 3 Encounters:  09/07/22 266 lb (120.7 kg)  06/16/22 264 lb 6.4 oz (119.9 kg)  06/05/22 267 lb 9.6 oz (121.4 kg)   Lab Results  Component Value Date   HGBA1C 5.9 05/26/2022   HGBA1C 5.7 (H) 12/19/2021   HGBA1C 5.6 04/07/2021  A/P: prediabetes and obesity (poor controL) noted- and is hitting plateau on weight loss around 250 on home scales- I think trial of ozempic to help with both is reasonable -long term weight wants to be around 200 then come off medicine and maintain between 190-210 -could have potential for issues in combo with vyvanse but wants to trial  #exercise induced asthma- with starting tennis back has noted coughing fits after finishes. Very similar to when he was younger - I think trial albuterol reasonable with poor  control- can use before exercise  Recommended follow up: Return in about 6 months (around 03/08/2023) for followup or sooner if needed.Schedule b4 you leave. Future Appointments  Date Time Provider Department Center  05/28/2023  9:00 AM Shelva Majestic, MD [facility]    Lab/Order associations:   ICD-10-CM   1. Prediabetes  R73.03     2. Hyperglycemia  R73.9     3. Mild intermittent asthma without complication  J45.20       Meds ordered this encounter  Medications   Semaglutide,0.25 or 0.5MG /DOS, (OZEMPIC, 0.25 OR 0.5 MG/DOSE,) 2 MG/3ML SOPN    Sig: Inject 0.25 mg into the skin once a week for 28 days, THEN 0.5 mg once a week for 28 days.    Dispense:  6 mL    Refill:  0   Semaglutide, 1 MG/DOSE, 4 MG/3ML SOPN    Sig: Inject 1 mg as directed once a week. After you finish starter dose    Dispense:  3 mL    Refill:  4   albuterol (VENTOLIN HFA) 108 (90 Base) MCG/ACT inhaler    Sig: Inhale 2 puffs into the lungs every 6 (six) hours as needed for wheezing or shortness of breath.    Dispense:  1 each    Refill:  2    Return precautions advised.  Tana Conch, MD

## 2022-09-07 NOTE — Patient Instructions (Addendum)
-   Trial Ozempic 0.25 mg weekly for 4 weeks - Then 0.5 mg weekly for another 4 weeks - Then 1 mg weekly until I see you back -likely need to submit prior auth- we also could look at [employer] if your insurance covers that  - I think trial albuterol reasonable with poor control- can use before exercise  Recommended follow up: Return in about 6 months (around 03/08/2023) for followup or sooner if needed.Schedule b4 you leave.

## 2022-09-11 ENCOUNTER — Encounter: Payer: Self-pay | Admitting: Primary Care

## 2022-09-14 ENCOUNTER — Telehealth: Payer: Self-pay | Admitting: Family Medicine

## 2022-09-14 NOTE — Telephone Encounter (Signed)
Patient states he received a text from [facility] stating the RX for Ozempic was not approved by insurance and to contact Dr. Durene Cal form next steps.  Patient requests to be called at ph# [phone] re: the above and next steps  Patient states he is trying to get this resolved before signing up for insurance plan for next year.

## 2022-09-14 NOTE — Telephone Encounter (Signed)
Dr. Durene Cal informed me that insurance doesn't cover Ozempic under pre-diabetes, only diabetes. Informed pt and he understood.

## 2022-09-14 NOTE — Telephone Encounter (Signed)
I called pt to inform him that his insurance [employer]) only covers Ozempic if under Diabetes on his plan. Pt stated that he thought Dr.Hunter ordered Rx due to him being Pre-Diabetes.

## 2022-10-14 ENCOUNTER — Other Ambulatory Visit: Payer: Self-pay | Admitting: Family Medicine

## 2022-10-15 MED ORDER — LISDEXAMFETAMINE DIMESYLATE 30 MG PO CAPS: 30.0000 mg | ORAL_CAPSULE | Freq: Every day | ORAL | 0 refills | Status: DC

## 2022-11-20 ENCOUNTER — Other Ambulatory Visit: Payer: Self-pay | Admitting: Family Medicine

## 2022-11-21 MED ORDER — LISDEXAMFETAMINE DIMESYLATE 30 MG PO CAPS
30.0000 mg | ORAL_CAPSULE | Freq: Every day | ORAL | 0 refills | Status: DC
Start: 2022-11-21 — End: 2023-01-03

## 2022-12-15 ENCOUNTER — Telehealth: Payer: Self-pay | Admitting: Family Medicine

## 2022-12-15 MED ORDER — ATORVASTATIN CALCIUM 80 MG PO TABS: 80.0000 mg | ORAL_TABLET | Freq: Every day | ORAL | 3 refills | Status: DC

## 2022-12-15 NOTE — Telephone Encounter (Signed)
Patient requests refill of atorvastatin be sent to [facility] on [address]. States [facility] and [facility] show no refills in system.

## 2022-12-15 NOTE — Telephone Encounter (Signed)
Rx sent to pharmacy   

## 2023-01-03 ENCOUNTER — Other Ambulatory Visit: Payer: Self-pay | Admitting: Family Medicine

## 2023-01-04 MED ORDER — LISDEXAMFETAMINE DIMESYLATE 30 MG PO CAPS: 30.0000 mg | ORAL_CAPSULE | Freq: Every day | ORAL | 0 refills | Status: DC

## 2023-01-04 NOTE — Telephone Encounter (Signed)
PDMP reviewed - no red flags I will refill this for patient x 1 month; please make sure he schedules follow-up visit with Hunter within the next 30 days to continue this prescription Last OV when this was discussed with PCP was Aug 2023.

## 2023-01-18 ENCOUNTER — Ambulatory Visit: Payer: PRIVATE HEALTH INSURANCE | Admitting: Family Medicine

## 2023-01-18 ENCOUNTER — Encounter: Payer: Self-pay | Admitting: Family Medicine

## 2023-01-18 VITALS — BP 130/70 | HR 68 | Temp 98.2°F | Ht 70.0 in | Wt 272.8 lb

## 2023-01-18 DIAGNOSIS — Z131 Encounter for screening for diabetes mellitus: Secondary | ICD-10-CM

## 2023-01-18 DIAGNOSIS — F900 Attention-deficit hyperactivity disorder, predominantly inattentive type: Secondary | ICD-10-CM

## 2023-01-18 DIAGNOSIS — E785 Hyperlipidemia, unspecified: Secondary | ICD-10-CM | POA: Diagnosis not present

## 2023-01-18 DIAGNOSIS — R7303 Prediabetes: Secondary | ICD-10-CM

## 2023-01-18 DIAGNOSIS — R739 Hyperglycemia, unspecified: Secondary | ICD-10-CM | POA: Diagnosis not present

## 2023-01-18 LAB — COMPREHENSIVE METABOLIC PANEL
ALT: 24 U/L (ref 0–53)
AST: 19 U/L (ref 0–37)
Albumin: 4.5 g/dL (ref 3.5–5.2)
Alkaline Phosphatase: 74 U/L (ref 39–117)
BUN: 14 mg/dL (ref 6–23)
CO2: 26 mEq/L (ref 19–32)
Calcium: 9.4 mg/dL (ref 8.4–10.5)
Chloride: 106 mEq/L (ref 96–112)
Creatinine, Ser: 0.99 mg/dL (ref 0.40–1.50)
GFR: 104.78 mL/min (ref >60.00)
Glucose, Bld: 101 mg/dL — ABNORMAL HIGH (ref 70–99)
Potassium: 4.2 mEq/L (ref 3.5–5.1)
Sodium: 139 mEq/L (ref 135–145)
Total Bilirubin: 0.5 mg/dL (ref 0.2–1.2)
Total Protein: 6.9 g/dL (ref 6.0–8.3)

## 2023-01-18 LAB — HEMOGLOBIN A1C: Hgb A1c MFr Bld: 5.8 % (ref 4.6–6.5)

## 2023-01-18 MED ORDER — OZEMPIC (0.25 OR 0.5 MG/DOSE) 2 MG/3ML ~~LOC~~ SOPN: PEN_INJECTOR | SUBCUTANEOUS | 0 refills | Status: DC

## 2023-01-18 MED ORDER — LISDEXAMFETAMINE DIMESYLATE 30 MG PO CAPS: 30.0000 mg | ORAL_CAPSULE | Freq: Every day | ORAL | 0 refills | Status: DC

## 2023-01-18 NOTE — Patient Instructions (Addendum)
Let us know if you get any other COVID vaccines.  Restart Ozempic  #Bump behind ear- started out small but has grown. No fever or chills. Also has some swelling and discomfort underneath area. Very tender to touch. No expanding redness- reports actually improved in last day- we discussed option to lance/incision and drainage but with improvement we opted to hold off and use warm compresses 4x a day to see if we can get area to drain- if worsens he can return and we can do drainage or refer to ENT  -could be irritated sebaceous cyst  Please stop by lab before you go If you have mychart- we will send your results within 3 business days of [place] receiving them.  If you do not have mychart- we will call you about results within 5 business days of [place] receiving them.  *please also note that you will see labs on mychart as soon as they post. I will later go in and write notes on them- will say "notes from Dr. Durene Cal"   Recommended follow up: Return for next already scheduled visit or sooner if needed.

## 2023-01-18 NOTE — Progress Notes (Signed)
Phone [phone] In person visit   Subjective:   Carlos Jacobs is a 27 y.o. year old very pleasant male patient who presents for/with See problem oriented charting No chief complaint on file.  Past Medical History-  Patient Active Problem List   Diagnosis Date Noted   PFO (patent foramen ovale) 05/02/2021    Priority: High   History of cardioembolic cerebrovascular accident (CVA) 04/07/2021    Priority: High   ADHD (attention deficit hyperactivity disorder)     Priority: High   Tetralogy of Fallot     Priority: High   Hyperglycemia 04/10/2021    Priority: Medium    Hyperlipidemia 04/10/2021    Priority: Medium    Asthma     Priority: Medium    Migraines     Priority: Medium    Depression     Priority: Medium    Former smoker 05/02/2018    Priority: Low   Ganglion cyst of dorsum of left wrist 05/02/2018    Priority: Low   Acne 05/01/2015    Priority: Low   Animal bite of left lower leg with infection 06/16/2022   Loud snoring 06/05/2022    Medications- reviewed and updated Current Outpatient Medications  Medication Sig Dispense Refill   albuterol (VENTOLIN HFA) 108 (90 Base) MCG/ACT inhaler Inhale 2 puffs into the lungs every 6 (six) hours as needed for wheezing or shortness of breath. 1 each 2   aspirin EC 81 MG tablet Take 1 tablet (81 mg total) by mouth daily. Swallow whole.     atorvastatin (LIPITOR) 80 MG tablet Take 1 tablet (80 mg total) by mouth daily. 90 tablet 3   lisdexamfetamine (VYVANSE) 30 MG capsule Take 1 capsule (30 mg total) by mouth daily before breakfast. 90 capsule 0   Melatonin 10 MG TABS Take 10 mg by mouth at bedtime as needed (sleep).     Semaglutide,0.25 or 0.5MG /DOS, (OZEMPIC, 0.25 OR 0.5 MG/DOSE,) 2 MG/3ML SOPN Inject 0.25 mg into the skin once a week for 28 days, THEN 0.5 mg once a week for 14 days. 6 mL 0   No current facility-administered medications for this visit.     Objective:  BP 130/70   Pulse 68   Temp 98.2 F  (36.8 C)   Ht 5\' 10"  (1.778 m)   Wt 272 lb 12.8 oz (123.7 kg)   SpO2 97%   BMI 39.14 kg/m  Gen: NAD, resting comfortably Behind left ear 1-1.5x 1-1.5 cm slightly red raised area behind left ear- some slight swelling below this and mild fullness.  CV: RRR no murmurs rubs or gallops Lungs: CTAB no crackles, wheeze, rhonchi Ext: no edema Skin: warm, dry    Assessment and Plan   # Hyperglycemia/insulin resistance/prediabetes S:  Medication: trialed Ozempic but did not get approved- wants to retry Exercise and diet- plans to get more active this summer, weight up another 6 lbs- feels being off of . Doing chicken broccoli cauliflower for most part when has Vyvanse in system- thinking being back on Vyvanse will help Wt Readings from Last 3 Encounters:  01/18/23 272 lb 12.8 oz (123.7 kg)  09/07/22 266 lb (120.7 kg)  06/16/22 264 lb 6.4 oz (119.9 kg)   Lab Results  Component Value Date   HGBA1C 5.9 05/26/2022   HGBA1C 5.7 (H) 12/19/2021   HGBA1C 5.6 04/07/2021  A/P: hopefully stable- update a1c and cmp today. Try Ozempic again- did not get approved previously  # ADD S:control: good on medicine- when off  medications notes more annoyed/less tolerant medication:  Vyvanse 30 mg (out last 1.5 weeks). Also notes some appetite suppression - when he missed 1.5 weeks appetite was very high Controlled substance contract: 01/02/22  NCCSRS/PDMP reviewed: yes, low risk fill pattern Original diagnosis: with psychiatry- last visit 2018- we have records on file-Kim Dansie MD hp, [place] doctor in my basket confirmed  A/P: attention deficit disorder well controlled- continue current medications - refill today  -we were going to get UDS today but he is in between insurances- will check next visit  #History of CVA #hyperlipidemia S: Medication:atorvastatin 80 mg  Lab Results  Component Value Date   CHOL 123 05/26/2022   HDL 40.80 05/26/2022   LDLCALC 70 05/26/2022   LDLDIRECT 52 12/19/2021   TRIG  60.0 05/26/2022   CHOLHDL 3 05/26/2022   A/P: stable- continue current medicines - at ideal goal  #Bump behind ear- started out small but has grown. No fever or chills. Also has some swelling and discomfort underneath area. Very tender to touch. No expanding redness- reports actually improved in last day- we discussed option to lance/incision and drainage but with improvement we opted to hold off and use warm compresses 4x a day to see if we can get area to drain- if worsens he can return and we can do drainage or refer to ENT  -could be irritated sebaceous cyst  Recommended follow up: Return for next already scheduled visit or sooner if needed. Future Appointments  Date Time Provider Department Center  05/28/2023  9:00 AM Shelva Majestic, MD [facility]   Lab/Order associations:   ICD-10-CM   1. Attention deficit hyperactivity disorder (ADHD), predominantly inattentive type  F90.0     2. Hyperglycemia  R73.9 HgB A1c    3. Screening for diabetes mellitus  Z13.1 HgB A1c    4. Hyperlipidemia, unspecified hyperlipidemia type  E78.5 Comprehensive metabolic panel    5. Prediabetes  R73.03      Meds ordered this encounter  Medications   lisdexamfetamine (VYVANSE) 30 MG capsule    Sig: Take 1 capsule (30 mg total) by mouth daily before breakfast.    Dispense:  90 capsule    Refill:  0   Semaglutide,0.25 or 0.5MG /DOS, (OZEMPIC, 0.25 OR 0.5 MG/DOSE,) 2 MG/3ML SOPN    Sig: Inject 0.25 mg into the skin once a week for 28 days, THEN 0.5 mg once a week for 14 days.    Dispense:  6 mL    Refill:  0    Return precautions advised.  Tana Conch, MD

## 2023-01-21 ENCOUNTER — Encounter: Payer: Self-pay | Admitting: Family Medicine

## 2023-01-22 NOTE — Telephone Encounter (Signed)
Updated insurance card has been added to chart.

## 2023-01-22 NOTE — Telephone Encounter (Signed)
ADMIN: Please scan pt new insurance card.  RX PA Team: please initiate PA for Ozempic.

## 2023-01-27 ENCOUNTER — Encounter: Payer: Self-pay | Admitting: Family Medicine

## 2023-01-27 ENCOUNTER — Other Ambulatory Visit (HOSPITAL_COMMUNITY): Payer: Self-pay

## 2023-01-27 DIAGNOSIS — F900 Attention-deficit hyperactivity disorder, predominantly inattentive type: Secondary | ICD-10-CM

## 2023-01-28 ENCOUNTER — Other Ambulatory Visit: Payer: Self-pay

## 2023-01-28 ENCOUNTER — Other Ambulatory Visit (HOSPITAL_COMMUNITY): Payer: Self-pay

## 2023-01-28 MED ORDER — ATORVASTATIN CALCIUM 80 MG PO TABS: 80.0000 mg | ORAL_TABLET | Freq: Every day | ORAL | 3 refills | Status: DC

## 2023-01-28 MED ORDER — ALBUTEROL SULFATE HFA 108 (90 BASE) MCG/ACT IN AERS: 2.0000 | INHALATION_SPRAY | Freq: Four times a day (QID) | RESPIRATORY_TRACT | 2 refills | Status: DC | PRN

## 2023-01-28 MED ORDER — OZEMPIC (0.25 OR 0.5 MG/DOSE) 2 MG/3ML ~~LOC~~ SOPN: PEN_INJECTOR | SUBCUTANEOUS | 0 refills | Status: AC

## 2023-01-28 NOTE — Telephone Encounter (Signed)
Deja- you did not load the appropriate pharmacy and also- did you send in other medications he requested to the new pharmacy?

## 2023-01-29 ENCOUNTER — Other Ambulatory Visit: Payer: Self-pay

## 2023-01-29 ENCOUNTER — Telehealth: Payer: Self-pay

## 2023-01-29 ENCOUNTER — Other Ambulatory Visit (HOSPITAL_COMMUNITY): Payer: Self-pay

## 2023-01-29 NOTE — Telephone Encounter (Signed)
Pharmacy Patient Advocate Encounter   Received notification that prior authorization for Ozempic is required/requested.  Per Test Claim: prior authorization required   PA submitted on 01/29/23 to (ins) Child psychotherapist via [employer]  #  [id number] Status is pending

## 2023-02-01 NOTE — Telephone Encounter (Signed)
Last refill: 01/18/23 #90, 0 Last OV: 01/18/23 dx. ADHD

## 2023-02-01 NOTE — Telephone Encounter (Signed)
Please send this RX to [id number] - [place], [place] - [address] AT [address] [phone]

## 2023-02-03 NOTE — Telephone Encounter (Signed)
Pharmacy Patient Advocate Encounter  Received notification from [employer] that the request for prior authorization for Ozempic has been denied due to the information submitted did not meet the criteria necessary to approve this medication. There is no indication your patient has type 2 diabetes mellitus. Rosann Auerbach does not cover Bydureon, Byetta, Ozempic, Rybelsus or Trulicity for any other indication because it is only approved for type 2 diabetes mellitus.    Please be advised we currently do not have a Pharmacist to review denials, therefore you will need to process appeals accordingly as needed. Thanks for your support at this time.   You may call [phone] to appeal.   Denial letter attached to chart.

## 2023-02-04 NOTE — Telephone Encounter (Signed)
Patient requests RX sent to [facility] for  lisdexamfetamine (VYVANSE) 30 MG capsule be cancelled  and have RX be sent to: [facility] [address] [place], [place] - [address] AT [address] Phone: [phone]  Fax: [phone]      Patient requests to be called when RX has been sent to the above Pharmacy

## 2023-02-04 NOTE — Telephone Encounter (Signed)
Please let patient know that unfortunately his plan does not cover Ozempic unless he has diabetes

## 2023-02-05 MED ORDER — LISDEXAMFETAMINE DIMESYLATE 30 MG PO CAPS: 30.0000 mg | ORAL_CAPSULE | Freq: Every day | ORAL | 0 refills | Status: DC

## 2023-02-05 NOTE — Addendum Note (Signed)
Addended by: Asencion Partridge on: 02/05/2023 10:04 AM   Modules accepted: Orders

## 2023-02-08 NOTE — Telephone Encounter (Signed)
My chart message sent to pt to make aware.

## 2023-02-16 ENCOUNTER — Encounter: Payer: Self-pay | Admitting: Family Medicine

## 2023-05-07 ENCOUNTER — Other Ambulatory Visit: Payer: Self-pay | Admitting: Family Medicine

## 2023-05-07 MED ORDER — LISDEXAMFETAMINE DIMESYLATE 30 MG PO CAPS: 30.0000 mg | ORAL_CAPSULE | Freq: Every day | ORAL | 0 refills | Status: DC

## 2023-05-07 NOTE — Telephone Encounter (Signed)
LAST APPOINTMENT DATE: 05/07/2023   NEXT APPOINTMENT DATE: 05/28/2023    LAST REFILL: 02/05/2023  QTY: 90

## 2023-05-07 NOTE — Telephone Encounter (Signed)
Prescription Request  05/07/2023  LOV: 01/18/2023  What is the name of the medication or equipment?  lisdexamfetamine (VYVANSE) 30 MG capsule   Have you contacted your pharmacy to request a refill? Yes   Which pharmacy would you like this sent to?  [facility] [address] [place], [place] - [address] AT [facility] [address] [place]. [place] [address] Phone: [phone] Fax: [phone]    Patient notified that their request is being sent to the clinical staff for review and that they should receive a response within 2 business days.   Please advise at Mobile [phone] (mobile)

## 2023-05-12 ENCOUNTER — Encounter (INDEPENDENT_AMBULATORY_CARE_PROVIDER_SITE_OTHER): Payer: Self-pay

## 2023-05-28 ENCOUNTER — Ambulatory Visit (INDEPENDENT_AMBULATORY_CARE_PROVIDER_SITE_OTHER): Payer: PRIVATE HEALTH INSURANCE | Admitting: Family Medicine

## 2023-05-28 ENCOUNTER — Encounter: Payer: Self-pay | Admitting: Family Medicine

## 2023-05-28 VITALS — BP 112/76 | HR 65 | Temp 98.0°F | Ht 70.0 in | Wt 278.2 lb

## 2023-05-28 DIAGNOSIS — Z131 Encounter for screening for diabetes mellitus: Secondary | ICD-10-CM | POA: Diagnosis not present

## 2023-05-28 DIAGNOSIS — Z79899 Other long term (current) drug therapy: Secondary | ICD-10-CM

## 2023-05-28 DIAGNOSIS — Z0000 Encounter for general adult medical examination without abnormal findings: Secondary | ICD-10-CM

## 2023-05-28 DIAGNOSIS — Z1283 Encounter for screening for malignant neoplasm of skin: Secondary | ICD-10-CM

## 2023-05-28 DIAGNOSIS — E785 Hyperlipidemia, unspecified: Secondary | ICD-10-CM | POA: Diagnosis not present

## 2023-05-28 DIAGNOSIS — Z87891 Personal history of nicotine dependence: Secondary | ICD-10-CM | POA: Diagnosis not present

## 2023-05-28 DIAGNOSIS — F900 Attention-deficit hyperactivity disorder, predominantly inattentive type: Secondary | ICD-10-CM

## 2023-05-28 DIAGNOSIS — R739 Hyperglycemia, unspecified: Secondary | ICD-10-CM

## 2023-05-28 LAB — COMPREHENSIVE METABOLIC PANEL
ALT: 23 U/L (ref 0–53)
AST: 19 U/L (ref 0–37)
Albumin: 4.8 g/dL (ref 3.5–5.2)
Alkaline Phosphatase: 72 U/L (ref 39–117)
BUN: 19 mg/dL (ref 6–23)
CO2: 27 meq/L (ref 19–32)
Calcium: 9.5 mg/dL (ref 8.4–10.5)
Chloride: 100 meq/L (ref 96–112)
Creatinine, Ser: 1.13 mg/dL (ref 0.40–1.50)
GFR: 89.18 mL/min (ref >60.00)
Glucose, Bld: 101 mg/dL — ABNORMAL HIGH (ref 70–99)
Potassium: 4 meq/L (ref 3.5–5.1)
Sodium: 133 meq/L — ABNORMAL LOW (ref 135–145)
Total Bilirubin: 0.5 mg/dL (ref 0.2–1.2)
Total Protein: 7.6 g/dL (ref 6.0–8.3)

## 2023-05-28 LAB — URINALYSIS, ROUTINE W REFLEX MICROSCOPIC
Bilirubin Urine: NEGATIVE
Hgb urine dipstick: NEGATIVE
Ketones, ur: NEGATIVE
Leukocytes,Ua: NEGATIVE
Nitrite: NEGATIVE
Specific Gravity, Urine: >=1.03 — AB (ref 1.000–1.030)
Total Protein, Urine: NEGATIVE
Urine Glucose: NEGATIVE
Urobilinogen, UA: 0.2 (ref 0.0–1.0)
WBC, UA: NONE SEEN (ref 0–?)
pH: 6 (ref 5.0–8.0)

## 2023-05-28 LAB — LIPID PANEL
Cholesterol: 119 mg/dL (ref 0–200)
HDL: 36.6 mg/dL — ABNORMAL LOW (ref >39.00)
LDL Cholesterol: 63 mg/dL (ref 0–99)
NonHDL: 82.4
Total CHOL/HDL Ratio: 3
Triglycerides: 97 mg/dL (ref 0.0–149.0)
VLDL: 19.4 mg/dL (ref 0.0–40.0)

## 2023-05-28 LAB — CBC WITH DIFFERENTIAL/PLATELET
Basophils Absolute: 0 10*3/uL (ref 0.0–0.1)
Basophils Relative: 0.5 % (ref 0.0–3.0)
Eosinophils Absolute: 0.1 10*3/uL (ref 0.0–0.7)
Eosinophils Relative: 1.1 % (ref 0.0–5.0)
HCT: 47.8 % (ref 39.0–52.0)
Hemoglobin: 15.7 g/dL (ref 13.0–17.0)
Lymphocytes Relative: 41.8 % (ref 12.0–46.0)
Lymphs Abs: 3.1 10*3/uL (ref 0.7–4.0)
MCHC: 32.9 g/dL (ref 30.0–36.0)
MCV: 84 fl (ref 78.0–100.0)
Monocytes Absolute: 0.7 10*3/uL (ref 0.1–1.0)
Monocytes Relative: 9.2 % (ref 3.0–12.0)
Neutro Abs: 3.6 10*3/uL (ref 1.4–7.7)
Neutrophils Relative %: 47.4 % (ref 43.0–77.0)
Platelets: 284 10*3/uL (ref 150.0–400.0)
RBC: 5.69 Mil/uL (ref 4.22–5.81)
RDW: 13.1 % (ref 11.5–15.5)
WBC: 7.5 10*3/uL (ref 4.0–10.5)

## 2023-05-28 NOTE — Progress Notes (Signed)
Phone: [phone]    Subjective:  Patient presents today for their annual physical. Chief complaint-noted.   See problem oriented charting- ROS- full  review of systems was completed and negative  Per full ROS sheet completed by patient  The following were reviewed and entered/updated in epic: Past Medical History:  Diagnosis Date   ADHD (attention deficit hyperactivity disorder)    vyvanse 60mg    Anxiety    Asthma    exercise induced   Depression    anxiety/history when on 70mg  vyvanse- improved with decreased dose.    Migraines    Stroke [facility])    Tetralogy of Fallot    s/p repair. [place] [facility]. saw cardiology until about age 2-told return 7 years.    Patient Active Problem List   Diagnosis Date Noted   PFO (patent foramen ovale) 05/02/2021    Priority: High   History of cardioembolic cerebrovascular accident (CVA) 04/07/2021    Priority: High   ADHD (attention deficit hyperactivity disorder)     Priority: High   Tetralogy of Fallot     Priority: High   Hyperglycemia 04/10/2021    Priority: Medium    Hyperlipidemia 04/10/2021    Priority: Medium    Asthma     Priority: Medium    Migraines     Priority: Medium    Depression     Priority: Medium    Former smoker 05/02/2018    Priority: Low   Ganglion cyst of dorsum of left wrist 05/02/2018    Priority: Low   Acne 05/01/2015    Priority: Low   Animal bite of left lower leg with infection 06/16/2022   Loud snoring 06/05/2022   Past Surgical History:  Procedure Laterality Date   GANGLION CYST EXCISION  2014   PATENT FORAMEN OVALE(PFO) CLOSURE N/A 05/02/2021   Procedure: PATENT FORAMEN OVALE (PFO) CLOSURE;  Surgeon: Tonny Bollman, MD;  Location: [facility] INVASIVE CV LAB;  Service: Cardiovascular;  Laterality: N/A;   TEE WITHOUT CARDIOVERSION N/A 04/07/2021   Procedure: TRANSESOPHAGEAL ECHOCARDIOGRAM (TEE);  Surgeon: Lewayne Bunting, MD;  Location: [facility] ENDOSCOPY;  Service: Cardiovascular;  Laterality: N/A;    TETRALOGY OF FALLOT REPAIR      Family History  Adopted: Yes  Problem Relation Age of Onset   Other Mother        adopted   Heart attack Father        age 35-related to stress from severe staph infection behind ear   Stroke Father        only 12 years older than patient   ADD / ADHD Half-Brother    Anxiety disorder Half-Brother    Depression Half-Brother    Drug abuse Half-Brother    Alcohol abuse Other        biological parents per adopted father    Medications- reviewed and updated Current Outpatient Medications  Medication Sig Dispense Refill   albuterol (VENTOLIN HFA) 108 (90 Base) MCG/ACT inhaler Inhale 2 puffs into the lungs every 6 (six) hours as needed for wheezing or shortness of breath. 1 each 2   aspirin EC 81 MG tablet Take 1 tablet (81 mg total) by mouth daily. Swallow whole.     atorvastatin (LIPITOR) 80 MG tablet Take 1 tablet (80 mg total) by mouth daily. 90 tablet 3   lisdexamfetamine (VYVANSE) 30 MG capsule Take 1 capsule (30 mg total) by mouth daily before breakfast. 90 capsule 0   Melatonin 10 MG TABS Take 10 mg by mouth at bedtime as  needed (sleep).     No current facility-administered medications for this visit.    Allergies-reviewed and updated No Known Allergies  Social History   Social History Narrative   Family: Married 2020 to wife Glena Norfolk- wife in [place] school. Adopted Son of [place] [facility] [employer] (also patient of Dr. Durene Cal). 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  Oropharynx normal Neck: no thyromegaly CV: RRR no murmurs rubs or gallops Lungs: CTAB no crackles, wheeze, rhonchi Abdomen: soft/nontender/nondistended/normal bowel sounds. No rebound or guarding.  Ext: no edema Skin: warm, dry Neuro: grossly normal, moves all extremities, PERRLA    Assessment and Plan:  27 y.o. male presenting for annual physical.  Health Maintenance counseling: 1. Anticipatory guidance: Patient counseled regarding regular dental exams -q6 months, eye exams -minimum prescription- doesn't always go yearly,  avoiding smoking and second hand smoke, limiting alcohol to 2 beverages per day-1 week or 2 but knows not to take within at least 10 hours of Vyvanse, no illicit drugs.   2. Risk factor reduction:  Advised patient of need for regular exercise and diet rich and fruits and vegetables to reduce risk of heart attack and stroke.  Exercise- is trying to continue to walking at work (tons of steps) and also at gym 1-3days a week Diet/weight management-up 7 lbs from last physical- work stress has been tough - feels diet has been consistent- calorie counting he think shas helped at least not balloon so much Wt Readings from Last 3 Encounters:  05/28/23 278 lb 3.2 oz (126.2 kg)  01/18/23 272 lb 12.8 oz (123.7 kg)  09/07/22 266 lb (120.7 kg)  3. Immunizations/screenings/ancillary studies-consider fall flu and COVID shot  Immunization History  Administered Date(s) Administered   DTaP 03/24/1996, 06/01/1996, 07/20/1996, 06/25/1997, 09/07/2000   HIB (PRP-OMP) 03/24/1996, 06/01/1996, 07/20/1996, 05/15/1997   HPV Quadrivalent 05/16/2012, 07/20/2012, 11/29/2012   Hepatitis A 05/04/2011, 05/16/2012   Hepatitis B Jan 05, 1996, 02/22/1996, 12/11/1996   IPV 03/24/1996, 06/01/1996, 06/25/1997, 09/07/2000   Influenza Nasal 07/31/2009, 08/12/2011, 07/20/2012   Influenza,inj,Quad PF,6+ Mos 08/10/2016, 08/06/2017, 07/26/2022   Influenza-Unspecified 08/27/2007, 08/08/2008, 09/21/2008, 09/04/2016    MMR 06/15/1997, 11/03/2002   Meningococcal Conjugate 05/04/2011   Meningococcal polysaccharide vaccine (MPSV4) 05/25/2013   PFIZER Comirnaty(Gray Top)Covid-19 Tri-Sucrose Vaccine 07/15/2022   PFIZER(Purple Top)SARS-COV-2 Vaccination 12/15/2019, 01/10/2020, 09/04/2020   Pneumococcal Polysaccharide-23 10/13/2014   Tdap 01/24/2007, 04/30/2017   Varicella 01/24/1997, 05/25/2013   4. Prostate cancer screening- no family history but adopted, start at age 36   5. Colon cancer screening -  no family history but adopted, start at age 46  6. Skin cancer screening/prevention- wants referral for initial scan. advised regular sunscreen use. Denies worrisome, changing, or new skin lesions.  7. Testicular cancer screening- advised monthly self exams  8. STD screening- patient opts out-only active with wife 9. Smoking associated screening-former smoker-2019 2 packs/day for 3  to 4 years so pack 8 pack years-check urinalysis  Status of chronic or acute concerns   #social update- they are planning [place] trip most likely in may 2025 with wife, parents  # History of CVA-related to PFO now status post percutaneous closure with Dr. 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 Jens Som - was told possible 10 year recheck after last visit age 37    # ADD S:control: reasonable control- not perfect medication: Vyvanse 30 mg- anxiety higher doses.  Mild appetite suppression with this and significant increased appetite off of medication Controlled substance contract: 01/02/2022 NCCSRS/PDMP reviewed: yes- low  risk trend Original diagnosis: records from [facility], [place] doctor in my basket confirmed- may rx vyvanse UDS: today  A/P: doing well- continue current medications - can refill through 6 months   # Hyperglycemia/insulin resistance/prediabetes-A1c as high as 5.9 S:  Medication: None-Ozempic was not covered x2  A/P: hopefully stable- update a1c today. Continue without meds for now   # Exercise-induced asthma-albuterol available if needed    # Likely irritated sebaceous cyst-noted behind his ear at last visit-today he reports ended up draining on its own   Recommended follow up: Return in about 6 months (around 11/28/2023) for followup or sooner if needed.Schedule b4 you leave. Future Appointments  Date Time Provider Department Center  06/01/2024  8:20 AM Shelva Majestic, MD [facility]   Lab/Order associations: fasting   ICD-10-CM   1. Preventative health care  Z00.00     2. Hyperlipidemia, unspecified hyperlipidemia type  E78.5 Comprehensive metabolic panel    CBC with Differential/Platelet    Lipid panel    3. Hyperglycemia  R73.9 Hemoglobin A1c    4. Former smoker  Z87.891 Urinalysis, Routine w reflex microscopic    5. Screening for diabetes mellitus  Z13.1 Hemoglobin A1c    6. High risk medication use  Z79.899 DRUG MONITORING, PANEL 8 WITH CONFIRMATION, URINE    7. Attention deficit hyperactivity disorder (ADHD), predominantly inattentive type  F90.0 DRUG MONITORING, PANEL 8 WITH CONFIRMATION, URINE    8. Skin cancer screening  Z12.83 Ambulatory referral to Dermatology     No orders of the defined types were placed in this encounter.  Return precautions advised.   Tana Conch, MD

## 2023-05-28 NOTE — Patient Instructions (Addendum)
We have placed a referral for you today to dermatology - [facility]. In some cases you will see # listed below- you can call this if you have not heard within a week. If you do not see # listed- you should receive a mychart message or phone call within a week with the # to call directly- call that as soon as you get it. If you are having issues getting scheduled reach out to [place] again.   Definitely want to work on weight loss  Please stop by lab before you go If you have mychart- we will send your results within 3 business days of [place] receiving them.  If you do not have mychart- we will call you about results within 5 business days of [place] receiving them.  *please also note that you will see labs on mychart as soon as they post. I will later go in and write notes on them- will say "notes from Dr. Durene Cal"   Recommended follow up: Return in about 6 months (around 11/28/2023) for followup or sooner if needed.Schedule b4 you leave.

## 2023-05-31 LAB — HEMOGLOBIN A1C: Hgb A1c MFr Bld: 5.9 % (ref 4.6–6.5)

## 2023-06-01 LAB — DRUG MONITORING, PANEL 8 WITH CONFIRMATION, URINE
6 Acetylmorphine: NEGATIVE ng/mL (ref <10)
Alcohol Metabolites: NEGATIVE ng/mL (ref <500)
Amphetamine: 3403 ng/mL — ABNORMAL HIGH (ref <250)
Amphetamines: POSITIVE ng/mL — AB (ref <500)
Benzodiazepines: NEGATIVE ng/mL (ref <100)
Buprenorphine, Urine: NEGATIVE ng/mL (ref <5)
Cocaine Metabolite: NEGATIVE ng/mL (ref <150)
Creatinine: 218.9 mg/dL (ref >=20.0)
MDMA: NEGATIVE ng/mL (ref <500)
Marijuana Metabolite: NEGATIVE ng/mL (ref <20)
Methamphetamine: NEGATIVE ng/mL (ref <250)
Opiates: NEGATIVE ng/mL (ref <100)
Oxidant: NEGATIVE ug/mL (ref <200)
Oxycodone: NEGATIVE ng/mL (ref <100)
pH: 5.1 (ref 4.5–9.0)

## 2023-06-01 LAB — DM TEMPLATE

## 2023-08-09 ENCOUNTER — Other Ambulatory Visit: Payer: Self-pay | Admitting: Family Medicine

## 2023-10-29 ENCOUNTER — Other Ambulatory Visit: Payer: Self-pay | Admitting: Family

## 2023-11-24 ENCOUNTER — Other Ambulatory Visit: Payer: Self-pay | Admitting: Family Medicine

## 2023-11-25 NOTE — Telephone Encounter (Signed)
Due for his 72-month follow-up-declined this

## 2023-11-29 NOTE — Telephone Encounter (Signed)
 Refill declined due to over due for 6 month f/u, please scheduleov for refills.

## 2023-12-02 ENCOUNTER — Other Ambulatory Visit: Payer: Self-pay | Admitting: *Deleted

## 2023-12-02 ENCOUNTER — Telehealth: Payer: Self-pay | Admitting: Family Medicine

## 2023-12-02 MED ORDER — LISDEXAMFETAMINE DIMESYLATE 30 MG PO CAPS: 30.0000 mg | ORAL_CAPSULE | Freq: Every day | ORAL | 0 refills | Status: DC

## 2023-12-02 NOTE — Addendum Note (Signed)
 Addended byZoe Lan on: 12/02/2023 12:46 PM   Modules accepted: Orders

## 2023-12-02 NOTE — Telephone Encounter (Signed)
 I am okay ordering 30-day supply-with that being said when I try to order it it says there is an invalid provider-I have never seen this happen before-Hosny can you look at this for me?  Please let patient know we are trying to order but running into some technical difficulties

## 2023-12-02 NOTE — Telephone Encounter (Signed)
 Please let patient know I sent in 30 days.   Please let e2c2 know that they cannot open encounter with e2c2 as location- has to be the medical office such as [place] [facility]. For instance with this telephone encounter lists e2c2 and would not allow [place] to submit the refill. We had to enter separate orders only encounter to send this.

## 2023-12-02 NOTE — Addendum Note (Signed)
 Addended by: Shelva Majestic on: 12/02/2023 12:36 PM   Modules accepted: Orders

## 2023-12-02 NOTE — Addendum Note (Signed)
 Addended by: Shelva Majestic on: 12/02/2023 11:08 AM   Modules accepted: Orders

## 2023-12-02 NOTE — Addendum Note (Signed)
 Addended by: Shelva Majestic on: 12/02/2023 12:50 PM   Modules accepted: Orders

## 2023-12-02 NOTE — Telephone Encounter (Signed)
 Copied from CRM [id number]. Topic: Appointments - Appointment Scheduling >> Dec 01, 2023 12:57 PM [place] J wrote: Patient needs to schedule appointment for follow up in order to receive lisdexamfetamine (VYVANSE) 30 MG medication. Patient only has 4 pills left and would like to know if he has to be specifically seen with Dr. Durene Cal or if it's okay to schedule an earlier date with a different provider.  Next Visit on 12/21/2023 [facility]

## 2023-12-02 NOTE — Telephone Encounter (Signed)
 Copied from CRM [id number]. Topic: Clinical - Prescription Issue >> Dec 02, 2023  9:42 AM Irine Seal wrote: Reason for CRM: patient is scheduled for med refill appt 12/21/23 he has 1 week left lisdexamfetamine (VYVANSE) 30 MG capsule, patient is requesting a partial refill to hold him until his appt

## 2023-12-21 ENCOUNTER — Encounter: Payer: Self-pay | Admitting: Family Medicine

## 2023-12-21 ENCOUNTER — Telehealth (INDEPENDENT_AMBULATORY_CARE_PROVIDER_SITE_OTHER): Payer: PRIVATE HEALTH INSURANCE | Admitting: Family Medicine

## 2023-12-21 VITALS — Ht 70.0 in | Wt 270.0 lb

## 2023-12-21 DIAGNOSIS — R7303 Prediabetes: Secondary | ICD-10-CM | POA: Diagnosis not present

## 2023-12-21 DIAGNOSIS — F988 Other specified behavioral and emotional disorders with onset usually occurring in childhood and adolescence: Secondary | ICD-10-CM | POA: Diagnosis not present

## 2023-12-21 DIAGNOSIS — E785 Hyperlipidemia, unspecified: Secondary | ICD-10-CM

## 2023-12-21 MED ORDER — LISDEXAMFETAMINE DIMESYLATE 30 MG PO CAPS: 30.0000 mg | ORAL_CAPSULE | Freq: Every day | ORAL | 0 refills | Status: DC

## 2023-12-21 NOTE — Patient Instructions (Signed)
 There are no preventive care reminders to display for this patient.  Recommended follow up: No follow-ups on file.

## 2023-12-21 NOTE — Progress Notes (Signed)
 Phone [phone] Virtual visit via Video note   Subjective:  Chief complaint: Chief Complaint  Patient presents with   ADHD    Pt needing refills.   Our team/I connected with Carlos Jacobs at  2:20 PM EDT by a video enabled telemedicine application (caregility through epic) and verified that I am speaking with the correct person using two identifiers. Our team/I discussed the limitations of evaluation and management by telemedicine and the availability of in person appointments.No physical exam was performed (except for noted visual exam or audio findings with Telehealth visits).   Location patient: [place] Location provider: [facility], office Persons participating in the virtual visit:  patient  Past Medical History-  Patient Active Problem List   Diagnosis Date Noted   PFO (patent foramen ovale) 05/02/2021    Priority: High   History of cardioembolic cerebrovascular accident (CVA) 04/07/2021    Priority: High   ADHD (attention deficit hyperactivity disorder)     Priority: High   Tetralogy of Fallot     Priority: High   Hyperglycemia 04/10/2021    Priority: Medium    Hyperlipidemia 04/10/2021    Priority: Medium    Asthma     Priority: Medium    Migraines     Priority: Medium    Depression     Priority: Medium    Former smoker 05/02/2018    Priority: Low   Ganglion cyst of dorsum of left wrist 05/02/2018    Priority: Low   Acne 05/01/2015    Priority: Low   Animal bite of left lower leg with infection 06/16/2022   Loud snoring 06/05/2022    Medications- reviewed and updated Current Outpatient Medications  Medication Sig Dispense Refill   albuterol (VENTOLIN HFA) 108 (90 Base) MCG/ACT inhaler Inhale 2 puffs into the lungs every 6 (six) hours as needed for wheezing or shortness of breath. 1 each 2   aspirin EC 81 MG tablet Take 1 tablet (81 mg total) by mouth daily. Swallow whole.     atorvastatin (LIPITOR) 80 MG tablet Take 1 tablet (80 mg total) by  mouth daily. 90 tablet 3   Melatonin 10 MG TABS Take 10 mg by mouth at bedtime as needed (sleep).     lisdexamfetamine (VYVANSE) 30 MG capsule Take 1 capsule (30 mg total) by mouth daily. 90 capsule 0   No current facility-administered medications for this visit.     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 Excell Seltzer in 2020 #hyperlipidemia S: Medication:Atorvastatin 80 mg, aspirin 81 mg Lab Results  Component Value Date   CHOL 119 05/28/2023   HDL 36.60 (L) 05/28/2023   LDLCALC 63 05/28/2023   LDLDIRECT 52 12/19/2021   TRIG 97.0 05/28/2023   CHOLHDL 3 05/28/2023  A/P: lipids at goal last visit even with CVA history even though that was more related to PFO which is now closed  # ADD S:control: continued reasonable control- worse on weekend day if misses dose then have task that requires focus medication: Vyvanse 30 mg.  Mild appetite suppression with this and significant increased appetite off of medication so ends up taking  daily Controlled substance contract: 01/02/2022 NCCSRS/PDMP reviewed: low risk Original diagnosis: records from [facility], [place] doctor  [id number] on 05/28/23  A/P: attention deficit disorder is reasonably controlled- continue current medications- gave 3 months prescription and he will reach out for next 90 day prescription closer to that time    # Hyperglycemia/insulin resistance/prediabetes-A1c as high as 5.9 S:  Medication: None-Ozempic was not covered Exercise and diet- working on getting into running with his wife and improve diet- some high volume lower calorie meals and doing calorie counting. He's on 1700  calorie a day Lab Results  Component Value Date   HGBA1C 5.9 05/28/2023   HGBA1C 5.8 01/18/2023   HGBA1C 5.9 05/26/2022   A/P: hopefully improving- update a1c at follow up    Recommended follow up: at physical in september Future Appointments  Date Time Provider Department Center  06/29/2024  8:20 AM Shelva Majestic, MD [facility]    Lab/Order associations:   ICD-10-CM   1. Attention deficit disorder (ADD) in adult  F98.8     2. Prediabetes  R73.03     3. Hyperlipidemia, unspecified hyperlipidemia type  E78.5       Meds ordered this encounter  Medications   lisdexamfetamine (VYVANSE) 30 MG capsule    Sig: Take 1 capsule (30 mg total) by mouth daily.    Dispense:  90 capsule    Refill:  0    Return precautions advised.  Tana Conch, MD

## 2023-12-30 ENCOUNTER — Encounter (HOSPITAL_BASED_OUTPATIENT_CLINIC_OR_DEPARTMENT_OTHER): Payer: Self-pay

## 2023-12-30 ENCOUNTER — Other Ambulatory Visit: Payer: Self-pay

## 2023-12-30 ENCOUNTER — Emergency Department (HOSPITAL_BASED_OUTPATIENT_CLINIC_OR_DEPARTMENT_OTHER): Payer: PRIVATE HEALTH INSURANCE

## 2023-12-30 ENCOUNTER — Emergency Department (HOSPITAL_BASED_OUTPATIENT_CLINIC_OR_DEPARTMENT_OTHER)
Admission: EM | Admit: 2023-12-30 | Discharge: 2023-12-31 | Disposition: A | Discharge status: Home / Self Care | Payer: PRIVATE HEALTH INSURANCE | Attending: Emergency Medicine | Admitting: Emergency Medicine

## 2023-12-30 DIAGNOSIS — R000 Tachycardia, unspecified: Secondary | ICD-10-CM | POA: Diagnosis not present

## 2023-12-30 DIAGNOSIS — Z7982 Long term (current) use of aspirin: Secondary | ICD-10-CM | POA: Insufficient documentation

## 2023-12-30 DIAGNOSIS — N200 Calculus of kidney: Secondary | ICD-10-CM

## 2023-12-30 DIAGNOSIS — N4400 Torsion of testis, unspecified: Secondary | ICD-10-CM | POA: Diagnosis not present

## 2023-12-30 DIAGNOSIS — R109 Unspecified abdominal pain: Secondary | ICD-10-CM | POA: Diagnosis present

## 2023-12-30 LAB — CBC
HCT: 43.8 % (ref 39.0–52.0)
Hemoglobin: 15.2 g/dL (ref 13.0–17.0)
MCH: 28.1 pg (ref 26.0–34.0)
MCHC: 34.7 g/dL (ref 30.0–36.0)
MCV: 81.1 fL (ref 80.0–100.0)
Platelets: 270 10*3/uL (ref 150–400)
RBC: 5.4 MIL/uL (ref 4.22–5.81)
RDW: 12 % (ref 11.5–15.5)
WBC: 12 10*3/uL — ABNORMAL HIGH (ref 4.0–10.5)
nRBC: 0 % (ref 0.0–0.2)

## 2023-12-30 LAB — COMPREHENSIVE METABOLIC PANEL
ALT: 19 U/L (ref 0–44)
AST: 17 U/L (ref 15–41)
Albumin: 4.7 g/dL (ref 3.5–5.0)
Alkaline Phosphatase: 59 U/L (ref 38–126)
Anion gap: 10 (ref 5–15)
BUN: 20 mg/dL (ref 6–20)
CO2: 24 mmol/L (ref 22–32)
Calcium: 9.5 mg/dL (ref 8.9–10.3)
Chloride: 105 mmol/L (ref 98–111)
Creatinine, Ser: 1.15 mg/dL (ref 0.61–1.24)
GFR, Estimated: >60 mL/min (ref >60)
Glucose, Bld: 128 mg/dL — ABNORMAL HIGH (ref 70–99)
Potassium: 3.5 mmol/L (ref 3.5–5.1)
Sodium: 139 mmol/L (ref 135–145)
Total Bilirubin: 0.6 mg/dL (ref 0.0–1.2)
Total Protein: 7.3 g/dL (ref 6.5–8.1)

## 2023-12-30 LAB — LIPASE, BLOOD: Lipase: 31 U/L (ref 11–51)

## 2023-12-30 LAB — URINALYSIS, ROUTINE W REFLEX MICROSCOPIC
Bilirubin Urine: NEGATIVE
Glucose, UA: NEGATIVE mg/dL
Ketones, ur: NEGATIVE mg/dL
Leukocytes,Ua: NEGATIVE
Nitrite: NEGATIVE
Specific Gravity, Urine: 1.034 — ABNORMAL HIGH (ref 1.005–1.030)
pH: 5.5 (ref 5.0–8.0)

## 2023-12-30 MED ORDER — FENTANYL CITRATE PF 50 MCG/ML IJ SOSY
100.0000 ug | PREFILLED_SYRINGE | Freq: Once | INTRAMUSCULAR | Status: AC
  Administered 2023-12-30: 100 ug via INTRAVENOUS

## 2023-12-30 MED ORDER — IOHEXOL 300 MG/ML  SOLN
100.0000 mL | Freq: Once | INTRAMUSCULAR | Status: AC | PRN
  Administered 2023-12-30: 100 mL via INTRAVENOUS

## 2023-12-30 MED ORDER — ONDANSETRON 4 MG PO TBDP
4.0000 mg | ORAL_TABLET | Freq: Once | ORAL | Status: AC | PRN
  Administered 2023-12-30: 4 mg via ORAL
  Filled 2023-12-30: qty 1

## 2023-12-30 MED ORDER — FENTANYL CITRATE PF 50 MCG/ML IJ SOSY
50.0000 ug | PREFILLED_SYRINGE | INTRAMUSCULAR | Status: DC | PRN
  Filled 2023-12-30 (×2): qty 1

## 2023-12-30 MED ORDER — KETOROLAC TROMETHAMINE 30 MG/ML IJ SOLN
30.0000 mg | Freq: Once | INTRAMUSCULAR | Status: AC
  Administered 2023-12-30: 30 mg via INTRAVENOUS
  Filled 2023-12-30: qty 1

## 2023-12-30 NOTE — ED Triage Notes (Signed)
 L Back pain starting one hour ago. Gradually worsening in pain, 10/10 pain. Patient tearful, vomiting during triage.  Urinating normally today but did report pain in his penis.

## 2023-12-30 NOTE — ED Provider Notes (Signed)
 [facility] EMERGENCY DEPARTMENT AT [facility] Provider Note   CSN: [id number] Arrival date & time: 12/30/23  2216     History {Add pertinent medical, surgical, social history, OB history to HPI:1} No chief complaint on file.   Carlos Jacobs is a 28 y.o. male presenting to ED with right flank pain.  Patient reports onset of pain approximately 2 hours ago, at the throbbing pain in his flank, which has become sharper, then severe and intolerable.  He said he vomited due to pain.  The pain appears to move closer to the penis and he reports pain that he feels in his penis as well.  He denies prior history of kidney stones.  Denies history of abdominal surgery.  He is here with his wife at bedside.  HPI     Home Medications Prior to Admission medications   Medication Sig Start Date End Date Taking? Authorizing Provider  albuterol (VENTOLIN HFA) 108 (90 Base) MCG/ACT inhaler Inhale 2 puffs into the lungs every 6 (six) hours as needed for wheezing or shortness of breath. 01/28/23   Shelva Majestic, MD  aspirin EC 81 MG tablet Take 1 tablet (81 mg total) by mouth daily. Swallow whole. 11/05/21   Janetta Hora, PA-C  atorvastatin (LIPITOR) 80 MG tablet Take 1 tablet (80 mg total) by mouth daily. 01/28/23   Shelva Majestic, MD  lisdexamfetamine (VYVANSE) 30 MG capsule Take 1 capsule (30 mg total) by mouth daily. 12/21/23   Shelva Majestic, MD  Melatonin 10 MG TABS Take 10 mg by mouth at bedtime as needed (sleep).    [provider]      Allergies    Patient has no known allergies.    Review of Systems   Review of Systems  Physical Exam Updated Vital Signs Wt 124.7 kg   BMI 39.46 kg/m  Physical Exam Constitutional:      General: He is in acute distress.  HENT:     Head: Normocephalic and atraumatic.  Eyes:     Conjunctiva/sclera: Conjunctivae normal.     Pupils: Pupils are equal, round, and reactive to light.  Cardiovascular:     Rate and  Rhythm: Regular rhythm. Tachycardia present.  Pulmonary:     Effort: Pulmonary effort is normal. No respiratory distress.  Abdominal:     General: There is no distension.     Tenderness: There is abdominal tenderness.  Skin:    General: Skin is warm and dry.  Neurological:     General: No focal deficit present.     Mental Status: He is alert. Mental status is at baseline.  Psychiatric:        Mood and Affect: Mood normal.        Behavior: Behavior normal.     ED Results / Procedures / Treatments   Labs (all labs ordered are listed, but only abnormal results are displayed) Labs Reviewed  LIPASE, BLOOD  COMPREHENSIVE METABOLIC PANEL  CBC  URINALYSIS, ROUTINE W REFLEX MICROSCOPIC    EKG None  Radiology No results found.  Procedures Procedures  {Document cardiac monitor, telemetry assessment procedure when appropriate:1}  Medications Ordered in ED Medications  fentaNYL (SUBLIMAZE) injection 100 mcg (has no administration in time range)  ketorolac (TORADOL) 30 MG/ML injection 30 mg (has no administration in time range)  ondansetron (ZOFRAN-ODT) disintegrating tablet 4 mg (4 mg Oral Given 12/30/23 2243)    ED Course/ Medical Decision Making/ A&P   {   Click here for ABCD2,  HEART and other calculatorsREFRESH Note before signing :1}                              Medical Decision Making Amount and/or Complexity of Data Reviewed Labs: ordered.  Risk Prescription drug management.   This patient presents to the ED with concern for flank pain, abdominal pain, nausea vomiting. This involves an extensive number of treatment options, and is a complaint that carries with it a high risk of complications and morbidity.  The differential diagnosis includes ureteral colic versus appendicitis versus other intra-abdominal process  Additional history obtained from the patient's wife at bedside   I ordered and personally interpreted labs.  The pertinent results include:  ***  I  ordered imaging studies including CT imaging, which was pending at the time of signout   The patient was maintained on a cardiac monitor.  I personally viewed and interpreted the cardiac monitored which showed an underlying rhythm of: sinus rhythm and sinus tachycardia  I ordered medication including IV pain and nausea medication  I have reviewed the patients home medicines and have made adjustments as needed  Test Considered: Lower suspicion for acute testicular torsion, no indication for testicular ultrasound  Dispostion:  Patient is signed out to Dr Judd Lien EDP pending follow up on CT imaging and labs, reassessment of patient's pain symptoms.   {Document critical care time when appropriate:1} {Document review of labs and clinical decision tools ie heart score, Chads2Vasc2 etc:1}  {Document your independent review of radiology images, and any outside records:1} {Document your discussion with family members, caretakers, and with consultants:1} {Document social determinants of health affecting pt's care:1} {Document your decision making why or why not admission, treatments were needed:1} Final Clinical Impression(s) / ED Diagnoses Final diagnoses:  None    Rx / DC Orders ED Discharge Orders     None

## 2023-12-30 NOTE — Discharge Instructions (Signed)
Return to the ER if you develop any new and/or concerning issues.

## 2023-12-30 NOTE — ED Provider Notes (Signed)
  Physical Exam  BP 122/81 (BP Location: Right Arm)   Pulse 67   Resp (!) 24   Wt 124.7 kg   SpO2 92%   BMI 39.46 kg/m   Physical Exam Vitals and nursing note reviewed.  Constitutional:      Appearance: Normal appearance.  HENT:     Head: Normocephalic and atraumatic.  Pulmonary:     Effort: Pulmonary effort is normal.  Skin:    General: Skin is warm and dry.  Neurological:     Mental Status: He is alert and oriented to person, place, and time.     Procedures  Procedures  ED Course / MDM   Clinical Course as of 12/30/23 2352  Thu Dec 30, 2023  2318 Signed out to Dr Judd Lien EDP [MT]    Clinical Course User Index [MT] Renaye Rakers Kermit Balo, MD   Medical Decision Making Amount and/or Complexity of Data Reviewed Labs: ordered. Radiology: ordered.  Risk Prescription drug management.   Care assumed from Dr. Renaye Rakers at shift change.  Patient awaiting results of a CT scan of the abdomen and pelvis.  He presents here after the sudden onset of right flank pain radiating into his groin.    CT scan has resulted and shows a small punctate stone within the urinary bladder I suspect representing a recently passed stone.  Upon my reassessment, patient is now pain-free and feels back to baseline.  Patient to be discharged with as needed return.       Geoffery Lyons, MD 12/30/23 [phone]

## 2024-01-19 ENCOUNTER — Other Ambulatory Visit: Payer: Self-pay | Admitting: Family Medicine

## 2024-03-07 ENCOUNTER — Ambulatory Visit (INDEPENDENT_AMBULATORY_CARE_PROVIDER_SITE_OTHER): Payer: PRIVATE HEALTH INSURANCE | Admitting: Family Medicine

## 2024-03-07 ENCOUNTER — Encounter: Payer: Self-pay | Admitting: Family Medicine

## 2024-03-07 VITALS — BP 100/70 | HR 90 | Temp 97.9°F | Ht 70.0 in | Wt 280.0 lb

## 2024-03-07 DIAGNOSIS — T148XXA Other injury of unspecified body region, initial encounter: Secondary | ICD-10-CM | POA: Diagnosis not present

## 2024-03-07 DIAGNOSIS — L729 Follicular cyst of the skin and subcutaneous tissue, unspecified: Secondary | ICD-10-CM

## 2024-03-07 DIAGNOSIS — E785 Hyperlipidemia, unspecified: Secondary | ICD-10-CM | POA: Diagnosis not present

## 2024-03-07 DIAGNOSIS — R7303 Prediabetes: Secondary | ICD-10-CM | POA: Diagnosis not present

## 2024-03-07 MED ORDER — KETOCONAZOLE 2 % EX CREA
1.0000 | TOPICAL_CREAM | Freq: Two times a day (BID) | CUTANEOUS | 0 refills | Status: AC
Start: 2024-03-07 — End: ?

## 2024-03-07 NOTE — Progress Notes (Signed)
 Phone [phone] In person visit   Subjective:   Carlos Jacobs is a 28 y.o. year old very pleasant male patient who presents for/with See problem oriented charting Chief Complaint  Patient presents with   cyst on ear lobe    Pt c/o cyst on right ear lobe   under arm irritation    Pt c/o irritation under right arm that started 2 weeks ago    Past Medical History-  Patient Active Problem List   Diagnosis Date Noted   PFO (patent foramen ovale) 05/02/2021    Priority: High   History of cardioembolic cerebrovascular accident (CVA) 04/07/2021    Priority: High   ADHD (attention deficit hyperactivity disorder)     Priority: High   Tetralogy of Fallot     Priority: High   Hyperglycemia 04/10/2021    Priority: Medium    Hyperlipidemia 04/10/2021    Priority: Medium    Asthma     Priority: Medium    Migraines     Priority: Medium    Depression     Priority: Medium    Former smoker 05/02/2018    Priority: Low   Ganglion cyst of dorsum of left wrist 05/02/2018    Priority: Low   Acne 05/01/2015    Priority: Low   Animal bite of left lower leg with infection 06/16/2022   Loud snoring 06/05/2022    Medications- reviewed and updated Current Outpatient Medications  Medication Sig Dispense Refill   albuterol  (VENTOLIN  HFA) 108 (90 Base) MCG/ACT inhaler Inhale 2 puffs into the lungs every 6 (six) hours as needed for wheezing or shortness of breath. 1 each 2   aspirin  EC 81 MG tablet Take 1 tablet (81 mg total) by mouth daily. Swallow whole.     atorvastatin  (LIPITOR ) 80 MG tablet TAKE ONE TABLET BY MOUTH ONE TIME DAILY 90 tablet 3   ketoconazole (NIZORAL) 2 % cream Apply 1 Application topically 2 (two) times daily. For 2 weeks 60 g 0   lisdexamfetamine (VYVANSE ) 30 MG capsule Take 1 capsule (30 mg total) by mouth daily. 90 capsule 0   Melatonin 10 MG TABS Take 10 mg by mouth at bedtime as needed (sleep).     No current facility-administered medications for this  visit.     Objective:  BP 100/70   Pulse 90   Temp 97.9 F (36.6 C)   Ht 5\' 10"  (1.778 m)   Wt 280 lb (127 kg)   SpO2 96%   BMI 40.18 kg/m  Gen: NAD, resting comfortably Base of right earlobe probably 1 x 1 cm cystic structure with minimal pain to palpation without erythema or warmth CV: RRR no murmurs rubs or gallops Lungs: CTAB no crackles, wheeze, rhonchi Abdomen: soft/nontender/nondistended/normal bowel sounds. No rebound or guarding.  Ext: no edema, in right axilla there is a 1 x 0.5 cm open wound- remnants of prior likely friction blister that burst open- surrounding tissues mild erythema but reports improving with topical antifungal OTC (available over the counter without a prescription)  Skin: warm, dry     Assessment and Plan   #social update- switching to wife's insurance next week through 5th of june     # Obesity morbid with BMI over 40 S: Patient continues to have difficulty with weight loss.  Originally he gained 100 lbs in college from 160 to 160. BMI over 40 which qualifies as morbid obesity. Has tried diet and exercise sparately and together. Wife already cooking healthy meals. Has tried behavioral  health counseling for diet changes in the past. Has hyperlipidemia and on atorvastation. History of stroke through primarily related to PFO Wt Readings from Last 3 Encounters:  03/07/24 280 lb (127 kg)  12/30/23 275 lb (124.7 kg)  12/21/23 270 lb (122.5 kg)   A/P: Morbid obesity status noted with BMI over 40-encouraged patient continue efforts on healthy eating/or exercise.  With his new insurance he is hopeful that GLP-1 like Zepbound will be covered and I am in agreement to send this in once his insurance slips over -He reports he will need a 27-month supply to [facility] and we would start at 2.5 mg  # Right axillary inflammation S: 2 weeks ago patient noted irritation under his right arm -went to trip to [place]  didn't check bag- his bag was rubbing on that  area and developed a clear fluid filled blister- tore open in shower- clear discharge then some blood and has not closed -worried about fungal infection- OTC (available over the counter without a prescription) like lotrimin as noted some odor -has noted some redness around the area of irritation- almost like a wax strip was ripped off - pain comes and goes worse if something presses on it. If misses cream bothers him more.  -denies fever or chills -thankfully improving some at this point A/P: Suspect patient had a friction blister that developed and later ruptured and later developed fungal skin infection which has improved with over-the-counter antifungals-she would like to try prescription strength though as there is some lingering rash in the axilla-I provided ketoconazole today-discussed follow-up if symptoms fail to improve.  We discussed open friction blister will need to heal by secondary intention unfortunately   # Right earlobe pain S: Patient has noted a cyst on his right earlobe- woke up about 2 weeks ago with this but over the last several days has finally improved some. Had similar last April on the left ear but was improving- this one also improving some as noted A/P: Appears to have cyst at base of right earlobe that has begun to improve-encouraged warm compresses.  Offered ENT consult but he wants to hold off on that for now   # History of CVA-related to PFO now status post percutaneous closure with Dr. Arlester Ladd in 2020 #hyperlipidemia S: Medication:Atorvastatin  80 mg, aspirin  81 mg  Lab Results  Component Value Date   CHOL 119 05/28/2023   HDL 36.60 (L) 05/28/2023   LDLCALC 63 05/28/2023   LDLDIRECT 52 12/19/2021   TRIG 97.0 05/28/2023   CHOLHDL 3 05/28/2023  A/P: Lipids very well-controlled on most recent check-continue current medication-with hopes to reduce stroke risk although this was primarily related to PFO now status post percutaneous closure and has done well since  that time   # Hyperglycemia/insulin resistance/prediabetes-A1c as high as 5.9 S:  Medication: None-Ozempic  was not covered by current insurance   Lab Results  Component Value Date   HGBA1C 5.9 05/28/2023   HGBA1C 5.8 01/18/2023   HGBA1C 5.9 05/26/2022  A/P: Prediabetes status noted-this would be another great reason for him to use a GLP-1-would like to check A1c at next visit and encouraged weight loss with healthy eating/regular exercise  Recommended follow up: Return for next already scheduled visit or sooner if needed. Future Appointments  Date Time Provider Department Center  06/29/2024  8:20 AM Almira Jaeger, MD [facility]    Lab/Order associations:   ICD-10-CM   1. Friction blister  T14.8XXA     2. Subcutaneous cyst  L72.9     3. Hyperlipidemia, unspecified hyperlipidemia type  E78.5     4. Prediabetes  R73.03       Meds ordered this encounter  Medications   ketoconazole (NIZORAL) 2 % cream    Sig: Apply 1 Application topically 2 (two) times daily. For 2 weeks    Dispense:  60 g    Refill:  0    Return precautions advised.  Clarisa Crooked, MD

## 2024-03-07 NOTE — Patient Instructions (Addendum)
 Hopefully this will slowly heal in the armpit- for the rash portion you have done a good job but lets try prescription strength antifungal twice daily for up to 2 weeks to really clear it out- not directly on the wound- follow up if new or worsening symptoms  Let me know if you want to see ENT- I think there is a cyst that  was inflammed and is now healing behind the ear- warm compresses up to 4x a day may help  Recommended follow up: Return for next already scheduled visit or sooner if needed.

## 2024-03-20 ENCOUNTER — Encounter: Payer: Self-pay | Admitting: Family Medicine

## 2024-03-21 ENCOUNTER — Other Ambulatory Visit (HOSPITAL_COMMUNITY): Payer: Self-pay

## 2024-03-21 ENCOUNTER — Telehealth: Payer: Self-pay

## 2024-03-21 NOTE — Telephone Encounter (Signed)
 Pharmacy Patient Advocate Encounter   Received notification from RX Request Messages that prior authorization for ZEPBOUND is required/requested.   Insurance verification completed.   The patient is insured through [employer] .   Per test insurance:

## 2024-03-21 NOTE — Telephone Encounter (Signed)
 Pt needing PA on Zepbound.

## 2024-03-22 NOTE — Telephone Encounter (Signed)
 Pt made aware via my chart

## 2024-03-22 NOTE — Telephone Encounter (Signed)
 Hi Erika, see pt message below.

## 2024-03-23 ENCOUNTER — Other Ambulatory Visit (HOSPITAL_COMMUNITY): Payer: Self-pay

## 2024-03-23 ENCOUNTER — Telehealth: Payer: Self-pay

## 2024-03-23 NOTE — Telephone Encounter (Signed)
 See below.  Copied from CRM [id number]. Topic: Referral - Prior Authorization Question >> Mar 23, 2024  2:56 PM Clyde Darling P wrote: Reason for CRM: Pt was advise for tell PCP to send RX for Zepbound to Pharmacy, Pharmacy will initiate the prior authorization for PCP to complete.

## 2024-03-23 NOTE — Telephone Encounter (Signed)
 I attempted to order but can you confirm the pharmacy-I have some slight recollection that he may have wanted to [facility] but I did not see that 1 listed

## 2024-03-23 NOTE — Addendum Note (Signed)
 Addended by: Almira Jaeger on: 03/23/2024 06:00 PM   Modules accepted: Orders

## 2024-03-24 ENCOUNTER — Telehealth: Payer: Self-pay

## 2024-03-24 MED ORDER — ZEPBOUND 2.5 MG/0.5ML ~~LOC~~ SOAJ: 2.5000 mg | SUBCUTANEOUS | 3 refills | Status: DC

## 2024-03-24 NOTE — Addendum Note (Signed)
 Addended by: Almira Jaeger on: 03/24/2024 12:48 PM   Modules accepted: Orders

## 2024-03-24 NOTE — Telephone Encounter (Signed)
 Pt is using [facility] pharmacy.  Copied from CRM [id number]. Topic: Referral - Prior Authorization Question >> Mar 23, 2024  2:56 PM Clyde Darling P wrote: Reason for CRM: Pt was advise for tell PCP to send RX for Zepbound to Pharmacy, Pharmacy will initiate the prior authorization for PCP to complete. >> Mar 23, 2024  5:02 PM Fredrica W wrote: Patient called states he thought medication would be sent to pharmacy but has not been send. States he need Rx for Zepbound sent to [facility] in order for it to be kicked back for PA. Not showing on patient list. He would like it sent to Pharmacy. Thank You

## 2024-03-24 NOTE — Telephone Encounter (Signed)
 [facility] using [facility].

## 2024-03-24 NOTE — Telephone Encounter (Signed)
 We were not sure if pharmacy originally-I just sent this to [facility]

## 2024-03-24 NOTE — Telephone Encounter (Signed)
 sent

## 2024-03-28 ENCOUNTER — Telehealth: Payer: Self-pay

## 2024-03-28 NOTE — Telephone Encounter (Signed)
 I have submitted this and mychart message to our PA Team. Copied from CRM [id number]. Topic: Clinical - Medication Prior Auth >> Mar 28, 2024 10:37 AM Lajean Pike wrote: Reason for CRM: Patient's wife Inge Mangle [phone] is calling back in regards to process of the Prior Authorization for patients Zepbound. She would appreciate a call back to her since it's through her insurance and she said it'll be easier to reach her vs the patient. She did state that patient did submit some documents via MyChart in regards. She would appreciate a follow up call.

## 2024-03-28 NOTE — Telephone Encounter (Signed)
 Patient's wife Inge Mangle [phone] is calling back in regards to process of the Prior Authorization for patients Zepbound. She would appreciate a call back to her since it's through her insurance and she said it'll be easier to reach her vs the patient. She did state that patient did submit some documents via MyChart in regards. She would appreciate a follow up call from the PA team.

## 2024-03-28 NOTE — Telephone Encounter (Signed)
 See below

## 2024-03-29 ENCOUNTER — Other Ambulatory Visit (HOSPITAL_COMMUNITY): Payer: Self-pay

## 2024-03-29 ENCOUNTER — Telehealth: Payer: Self-pay

## 2024-03-29 NOTE — Telephone Encounter (Signed)
 Communication has been sent via mychart.  Copied from CRM [id number]. Topic: Clinical - Medication Prior Auth >> Mar 29, 2024  7:51 AM Carlos Jacobs wrote: Reason for CRM: Patient's wife called in to follow up on prior authorization. Advised message has been sent to PA team. Would like Charletha Dalpe to give a call once PA has submitted to insurance.

## 2024-03-29 NOTE — Telephone Encounter (Signed)
 Mychart has been sent with below information.

## 2024-03-29 NOTE — Telephone Encounter (Signed)
 Pharmacy Patient Advocate Encounter  Received notification from [facility] that Prior Authorization for ZEPBOUND 2.5MG /0.5ML has been APPROVED from 03/29/24 to 11/29/24  -FYI, patient has a deductible that must be met. The copay would be $1040.65. The patient could try to get an eVoucher*[employer] , from the mfr of ZEPBOUND  and could cover $1015.66 and bring down copay to $24.99  PA #/Case ID/Reference #: [id number]

## 2024-03-29 NOTE — Telephone Encounter (Signed)
 Pharmacy Patient Advocate Encounter   Received notification from Pt Calls Messages that prior authorization for ZEPBOUND 2.5MG /0.5ML is required/requested.   Insurance verification completed.   The patient is insured through [employer] .   Per test claim: PA required; PA submitted to above mentioned insurance via Prompt PA Key/confirmation #/EOC [id number] Status is pending

## 2024-03-31 ENCOUNTER — Other Ambulatory Visit: Payer: Self-pay | Admitting: Family Medicine

## 2024-04-13 ENCOUNTER — Encounter: Payer: Self-pay | Admitting: Family Medicine

## 2024-04-17 MED ORDER — ZEPBOUND 5 MG/0.5ML ~~LOC~~ SOAJ: 5.0000 mg | SUBCUTANEOUS | 5 refills | Status: DC

## 2024-06-01 ENCOUNTER — Encounter: Payer: Managed Care, Other (non HMO) | Admitting: Family Medicine

## 2024-06-15 ENCOUNTER — Ambulatory Visit: Payer: PRIVATE HEALTH INSURANCE | Admitting: Family Medicine

## 2024-06-29 ENCOUNTER — Encounter: Payer: Self-pay | Admitting: Family Medicine

## 2024-06-29 ENCOUNTER — Ambulatory Visit: Payer: Self-pay | Admitting: Family Medicine

## 2024-06-29 ENCOUNTER — Ambulatory Visit (INDEPENDENT_AMBULATORY_CARE_PROVIDER_SITE_OTHER): Payer: PRIVATE HEALTH INSURANCE | Admitting: Family Medicine

## 2024-06-29 VITALS — BP 112/68 | HR 87 | Temp 97.4°F | Ht 70.0 in | Wt 252.4 lb

## 2024-06-29 DIAGNOSIS — R739 Hyperglycemia, unspecified: Secondary | ICD-10-CM

## 2024-06-29 DIAGNOSIS — Z0000 Encounter for general adult medical examination without abnormal findings: Secondary | ICD-10-CM

## 2024-06-29 DIAGNOSIS — E785 Hyperlipidemia, unspecified: Secondary | ICD-10-CM

## 2024-06-29 DIAGNOSIS — Z79899 Other long term (current) drug therapy: Secondary | ICD-10-CM

## 2024-06-29 DIAGNOSIS — Z6711 Type A blood, Rh negative: Secondary | ICD-10-CM

## 2024-06-29 DIAGNOSIS — Z131 Encounter for screening for diabetes mellitus: Secondary | ICD-10-CM

## 2024-06-29 DIAGNOSIS — Z87891 Personal history of nicotine dependence: Secondary | ICD-10-CM

## 2024-06-29 LAB — COMPREHENSIVE METABOLIC PANEL WITH GFR
ALT: 22 U/L (ref 0–53)
AST: 21 U/L (ref 0–37)
Albumin: 4.6 g/dL (ref 3.5–5.2)
Alkaline Phosphatase: 67 U/L (ref 39–117)
BUN: 13 mg/dL (ref 6–23)
CO2: 27 meq/L (ref 19–32)
Calcium: 9.3 mg/dL (ref 8.4–10.5)
Chloride: 105 meq/L (ref 96–112)
Creatinine, Ser: 1.06 mg/dL (ref 0.40–1.50)
GFR: 95.56 mL/min (ref >60.00)
Glucose, Bld: 101 mg/dL — ABNORMAL HIGH (ref 70–99)
Potassium: 4 meq/L (ref 3.5–5.1)
Sodium: 141 meq/L (ref 135–145)
Total Bilirubin: 0.7 mg/dL (ref 0.2–1.2)
Total Protein: 7.7 g/dL (ref 6.0–8.3)

## 2024-06-29 LAB — CBC WITH DIFFERENTIAL/PLATELET
Basophils Absolute: 0 K/uL (ref 0.0–0.1)
Basophils Relative: 0.5 % (ref 0.0–3.0)
Eosinophils Absolute: 0.1 K/uL (ref 0.0–0.7)
Eosinophils Relative: 1.4 % (ref 0.0–5.0)
HCT: 44.9 % (ref 39.0–52.0)
Hemoglobin: 15.1 g/dL (ref 13.0–17.0)
Lymphocytes Relative: 32.3 % (ref 12.0–46.0)
Lymphs Abs: 2.2 K/uL (ref 0.7–4.0)
MCHC: 33.6 g/dL (ref 30.0–36.0)
MCV: 82.1 fl (ref 78.0–100.0)
Monocytes Absolute: 0.7 K/uL (ref 0.1–1.0)
Monocytes Relative: 10.1 % (ref 3.0–12.0)
Neutro Abs: 3.7 K/uL (ref 1.4–7.7)
Neutrophils Relative %: 55.7 % (ref 43.0–77.0)
Platelets: 303 K/uL (ref 150.0–400.0)
RBC: 5.46 Mil/uL (ref 4.22–5.81)
RDW: 12.8 % (ref 11.5–15.5)
WBC: 6.7 K/uL (ref 4.0–10.5)

## 2024-06-29 LAB — URINALYSIS, ROUTINE W REFLEX MICROSCOPIC
Hgb urine dipstick: NEGATIVE
Ketones, ur: NEGATIVE
Leukocytes,Ua: NEGATIVE
Nitrite: NEGATIVE
RBC / HPF: NONE SEEN (ref 0–?)
Specific Gravity, Urine: >=1.03 — AB (ref 1.000–1.030)
Urine Glucose: NEGATIVE
Urobilinogen, UA: 1 (ref 0.0–1.0)
pH: 5.5 (ref 5.0–8.0)

## 2024-06-29 LAB — LIPID PANEL
Cholesterol: 104 mg/dL (ref 0–200)
HDL: 31.3 mg/dL — ABNORMAL LOW (ref >39.00)
LDL Cholesterol: 54 mg/dL (ref 0–99)
NonHDL: 72.59
Total CHOL/HDL Ratio: 3
Triglycerides: 94 mg/dL (ref 0.0–149.0)
VLDL: 18.8 mg/dL (ref 0.0–40.0)

## 2024-06-29 LAB — HEMOGLOBIN A1C: Hgb A1c MFr Bld: 6 % (ref 4.6–6.5)

## 2024-06-29 MED ORDER — LISDEXAMFETAMINE DIMESYLATE 30 MG PO CAPS: 30.0000 mg | ORAL_CAPSULE | Freq: Every day | ORAL | 0 refills | Status: DC

## 2024-06-29 NOTE — Patient Instructions (Addendum)
 Please stop by lab before you go If you have mychart- we will send your results within 3 business days of us  receiving them.  If you do not have mychart- we will call you about results within 5 business days of us  receiving them.  *please also note that you will see labs on mychart as soon as they post. I will later go in and write notes on them- will say notes from Dr. Katrinka   Recommended follow up: Return in about 6 months (around 12/27/2024) for followup or sooner if needed.Schedule b4 you leave.

## 2024-06-29 NOTE — Progress Notes (Signed)
 Phone: [phone]    Subjective:  Patient presents today for their annual physical. Chief complaint-noted.   See problem oriented charting- ROS- full  review of systems was completed and negative  except for topics noted under acute/chronic concerns  The following were reviewed and entered/updated in epic: Past Medical History:  Diagnosis Date   ADHD (attention deficit hyperactivity disorder)    vyvanse  60mg    Anxiety    Asthma    exercise induced   Depression    anxiety/history when on 70mg  vyvanse - improved with decreased dose.    Migraines    Stroke [facility])    Tetralogy of Fallot    s/p repair. [place] [facility]. saw cardiology until about age 58-told return 7 years.    Patient Active Problem List   Diagnosis Date Noted   PFO (patent foramen ovale) 05/02/2021    Priority: High   History of cardioembolic cerebrovascular accident (CVA) 04/07/2021    Priority: High   ADHD (attention deficit hyperactivity disorder)     Priority: High   Tetralogy of Fallot     Priority: High   Hyperglycemia 04/10/2021    Priority: Medium    Hyperlipidemia 04/10/2021    Priority: Medium    Asthma     Priority: Medium    Migraines     Priority: Medium    Depression     Priority: Medium    Former smoker 05/02/2018    Priority: Low   Ganglion cyst of dorsum of left wrist 05/02/2018    Priority: Low   Acne 05/01/2015    Priority: Low   Animal bite of left lower leg with infection 06/16/2022   Loud snoring 06/05/2022   Past Surgical History:  Procedure Laterality Date   GANGLION CYST EXCISION  2014   PATENT FORAMEN OVALE(PFO) CLOSURE N/A 05/02/2021   Procedure: PATENT FORAMEN OVALE (PFO) CLOSURE;  Surgeon: Wonda Sharper, MD;  Location: [facility] INVASIVE CV LAB;  Service: Cardiovascular;  Laterality: N/A;   TEE WITHOUT CARDIOVERSION N/A 04/07/2021   Procedure: TRANSESOPHAGEAL ECHOCARDIOGRAM (TEE);  Surgeon: Pietro Redell RAMAN, MD;  Location: [facility] ENDOSCOPY;  Service: Cardiovascular;  Laterality:  N/A;   TETRALOGY OF FALLOT REPAIR      Family History  Adopted: Yes  Problem Relation Age of Onset   Other Mother        adopted   Heart attack Father        age 3-related to stress from severe staph infection behind ear   Stroke Father        only 12 years older than patient   ADD / ADHD Half-Brother    Anxiety disorder Half-Brother    Depression Half-Brother    Drug abuse Half-Brother    Alcohol abuse Other        biological parents per adopted father    Medications- reviewed and updated Current Outpatient Medications  Medication Sig Dispense Refill   albuterol  (VENTOLIN  HFA) 108 (90 Base) MCG/ACT inhaler Inhale 2 puffs into the lungs every 6 (six) hours as needed for wheezing or shortness of breath. 1 each 2   aspirin  EC 81 MG tablet Take 1 tablet (81 mg total) by mouth daily. Swallow whole.     atorvastatin  (LIPITOR ) 80 MG tablet TAKE ONE TABLET BY MOUTH ONE TIME DAILY 90 tablet 3   ketoconazole  (NIZORAL ) 2 % cream Apply 1 Application topically 2 (two) times daily. For 2 weeks 60 g 0   Melatonin 10 MG TABS Take 10 mg by mouth at bedtime as needed (  sleep).     tirzepatide  (ZEPBOUND ) 5 MG/0.5ML Pen Inject 5 mg into the skin once a week. 2 mL 5   lisdexamfetamine (VYVANSE ) 30 MG capsule Take 1 capsule (30 mg total) by mouth daily. 90 capsule 0   No current facility-administered medications for this visit.    Allergies-reviewed and updated No Known Allergies  Social History   Social History Narrative   Family: Married 2020 to wife Tandy- wife in [employer] school. Adopted Son of Burnard Sharps [employer] (also patient of Dr. Katrinka). 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 Oropharynx normal Neck: no thyromegaly CV: RRR no murmurs rubs or gallops Lungs: CTAB no crackles, wheeze, rhonchi Abdomen: soft/nontender/nondistended/normal bowel sounds. No rebound or guarding.  Ext: no edema Skin: warm, dry Neuro: grossly normal, moves all extremities, PERRLA Declines genitourinary or rectal concerns/exam    Assessment and Plan:  28 y.o. male presenting for annual physical.  Health Maintenance counseling: 1. Anticipatory guidance: Patient counseled regarding regular dental exams -q6 months, eye exams - could schedule eye doctor visit,  avoiding smoking and second hand smoke , limiting alcohol to 2 beverages per day- he's abstaining during wife's pregnancy, no illicit drugs.   2. Risk factor reduction:  Advised patient of need for regular exercise and diet rich and fruits and vegetables to reduce risk of heart attack and stroke.  Exercise- 2-3 days a week cardio and weights.  Diet/weight management-weight down 20 pounds Zepbound  encouraged adequate protein intake and strength training to not lose muscle mass- feels could improve on protein intake. Reduction in food noise Wt Readings from Last 3 Encounters:  06/29/24 252 lb 6.4 oz (114.5 kg)  03/07/24 280 lb (127 kg)  12/30/23 275 lb (124.7 kg)  3. Immunizations/screenings/ancillary studies-declines flu and COVID and Prevnar 20  Immunization History  Administered Date(s) Administered   DTaP 03/24/1996, 06/01/1996, 07/20/1996, 06/25/1997, 09/07/2000   HIB (PRP-OMP) 03/24/1996, 06/01/1996, 07/20/1996, 05/15/1997   HPV Quadrivalent 05/16/2012, 07/20/2012, 11/29/2012   Hepatitis A 05/04/2011, 05/16/2012   Hepatitis B 05/10/96, 02/22/1996, 12/11/1996    IPV 03/24/1996, 06/01/1996, 06/25/1997, 09/07/2000   Influenza Nasal 07/31/2009, 08/12/2011, 07/20/2012   Influenza,inj,Quad PF,6+ Mos 08/10/2016, 08/06/2017, 07/26/2022   Influenza-Unspecified 08/27/2007, 08/08/2008, 09/21/2008, 09/04/2016   MMR 06/15/1997, 11/03/2002   Meningococcal Conjugate 05/04/2011   Meningococcal polysaccharide vaccine (MPSV4) 05/25/2013   PFIZER Comirnaty(Gray Top)Covid-19 Tri-Sucrose Vaccine 07/15/2022   PFIZER(Purple Top)SARS-COV-2 Vaccination 12/15/2019, 01/10/2020, 09/04/2020   Pneumococcal Polysaccharide-23 10/13/2014   Tdap 01/24/2007, 04/30/2017   Varicella 01/24/1997, 05/25/2013  4. Prostate cancer screening- no known family history but adopted- start 57  5. Colon cancer screening - no known family history but adopted- start at 89 6. Skin cancer screening/prevention- saw within last year-was told no regular follow up unless issues-dermatology specialists. advised regular sunscreen use. Denies  worrisome, changing, or new skin lesions.  7. Testicular cancer screening- advised monthly self exams  8. STD screening- patient opts out- only active with wife 9. Smoking associated screening- former smoker- 2019 2 packs a day for 3-4 years so 6-8 pack years- check urine  Status of chronic or acute concerns   #social update- wife [redacted] weeks pregnant  # History of CVA-related to PFO now status post percutaneous closure with Dr. Wonda in 2020 #hyperlipidemia S: Medication:Atorvastatin  80 mg, aspirin  81 mg  Lab Results  Component Value Date   CHOL 119 05/28/2023   HDL 36.60 (L) 05/28/2023   LDLCALC 63 05/28/2023   LDLDIRECT 52 12/19/2021   TRIG 97.0 05/28/2023   CHOLHDL 3 05/28/2023  A/P: update LDL and lipid panel- at goal last year even with stroke history even though primarily PFO based which was closed. 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  Pietro - was told possible 10 year recheck after last visit age 53  - but had echocardiogram at 8 years with PFO issues  # ADD S:control: reasonable control medication: Vyvanse  30 mg.  Mild appetite suppression with this and significant increased appetite off of medication Controlled substance contract: 01/02/2022 NCCSRS/PDMP reviewed: yes low risk Original diagnosis: records from [facility] Loss [facility], [place] doctor in my basket confirmed- may rx vyvanse  UDS: update today- did a year ago  A/P: attention deficit disorder well controlled continue current medications - update UDS    # Hyperglycemia/insulin resistance/prediabetes-A1c as high as 5.9 S:  Medication: Zepbound  5 mg for obesity Lab Results  Component Value Date   HGBA1C 5.9 05/28/2023   HGBA1C 5.8 01/18/2023   HGBA1C 5.9 05/26/2022   A/P: hopefully improved- update a1c today. Continue current meds for now   # Exercise-induced asthma-albuterol  available if needed- not needing lately   #requests blood type- knows may not be covered  Recommended follow up: Return in about 6 months (around 12/27/2024) for followup or sooner if needed.Schedule b4 you leave.  Lab/Order associations: fasting   ICD-10-CM   1. Preventative health care  Z00.00     2. Hyperglycemia  R73.9 Hemoglobin A1c    3. Hyperlipidemia, unspecified hyperlipidemia type  E78.5 Comprehensive metabolic panel with GFR    CBC with Differential/Platelet    Lipid panel    4. Screening for diabetes mellitus  Z13.1 Hemoglobin A1c    5. Former smoker  Z87.891 Urinalysis, Routine w reflex microscopic    6. High risk medication use  Z79.899 DRUG MONITORING, PANEL 8 WITH CONFIRMATION, URINE    7. Blood type A-  Z67.11 ABO AND RH       Meds ordered this encounter  Medications   lisdexamfetamine (VYVANSE ) 30 MG capsule    Sig: Take 1 capsule (30 mg total) by mouth daily.    Dispense:  90 capsule    Refill:  0    Return precautions advised.   Garnette Lukes, MD

## 2024-07-02 LAB — ABO AND RH

## 2024-07-02 LAB — DRUG MONITORING, PANEL 8 WITH CONFIRMATION, URINE
6 Acetylmorphine: NEGATIVE ng/mL (ref <10)
Alcohol Metabolites: NEGATIVE ng/mL (ref <500)
Amphetamine: 4716 ng/mL — ABNORMAL HIGH (ref <250)
Amphetamines: POSITIVE ng/mL — AB (ref <500)
Benzodiazepines: NEGATIVE ng/mL (ref <100)
Buprenorphine, Urine: NEGATIVE ng/mL (ref <5)
Cocaine Metabolite: NEGATIVE ng/mL (ref <150)
Creatinine: >300 mg/dL (ref >=20.0)
MDMA: NEGATIVE ng/mL (ref <500)
Marijuana Metabolite: NEGATIVE ng/mL (ref <20)
Methamphetamine: NEGATIVE ng/mL (ref <250)
Opiates: NEGATIVE ng/mL (ref <100)
Oxidant: NEGATIVE ug/mL (ref <200)
Oxycodone: NEGATIVE ng/mL (ref <100)
pH: 5.4 (ref 4.5–9.0)

## 2024-07-02 LAB — DM TEMPLATE

## 2024-09-06 ENCOUNTER — Encounter: Payer: Self-pay | Admitting: Family Medicine

## 2024-09-11 MED ORDER — ZEPBOUND 7.5 MG/0.5ML ~~LOC~~ SOAJ: 7.5000 mg | SUBCUTANEOUS | 5 refills | Status: DC

## 2024-09-28 ENCOUNTER — Telehealth: Admitting: Physician Assistant

## 2024-09-28 DIAGNOSIS — R051 Acute cough: Secondary | ICD-10-CM

## 2024-09-28 DIAGNOSIS — J069 Acute upper respiratory infection, unspecified: Secondary | ICD-10-CM

## 2024-09-28 MED ORDER — BENZONATATE 100 MG PO CAPS: 100.0000 mg | ORAL_CAPSULE | Freq: Three times a day (TID) | ORAL | 0 refills | Status: DC | PRN

## 2024-09-28 NOTE — Progress Notes (Signed)
 E visit for Flu like symptoms   We are sorry that you are not feeling well.  Here is how we plan to help! Based on what you have shared with me it looks like you may have flu-like symptoms that should be watched but do not seem to indicate anti-viral treatment.  Influenza or the flu is  an infection caused by a respiratory virus. The flu virus is highly contagious and persons who did not receive their yearly flu vaccination may catch the flu from close contact.  For nasal congestion, you may use an oral decongestant such as Mucinex D or if you have glaucoma or high blood pressure use plain Mucinex.  Saline nasal spray or nasal drops can help and can safely be used as often as needed for congestion.  If you have a sore or scratchy throat, use a saltwater gargle-  to  teaspoon of salt dissolved in a 4-ounce to 8-ounce glass of warm water.  Gargle the solution for approximately 15-30 seconds and then spit.  It is important not to swallow the solution.  You can also use throat lozenges/cough drops and Chloraseptic spray to help with throat pain or discomfort.  Warm or cold liquids can also be helpful in relieving throat pain.  For headache, pain or general discomfort, you can use Ibuprofen or Tylenol  as directed.   Some authorities believe that zinc sprays or the use of Echinacea may shorten the course of your symptoms.  I have prescribed the following medications to help lessen symptoms: I have prescribed Tessalon  Perles 100 mg. You may take 1-2 capsules every 8 hours as needed for cough  You are to isolate at home until you have been fever-free for at least 24 hours without a fever-reducing medication, and symptoms have been steadily improving for 24 hours.  If you must be around other household members who do not have symptoms, you need to make sure that both you and the family members are masking consistently with a high-quality mask.  If you note any worsening of symptoms despite treatment,  please seek an in-person evaluation ASAP. If you note any significant shortness of breath or any chest pain, please seek ED evaluation. Please do not delay care!  ANYONE WHO HAS FLU SYMPTOMS SHOULD: Stay home. The flu is highly contagious and going out or to work exposes others! Be sure to drink plenty of fluids. Water is fine as well as fruit juices, sodas and electrolyte beverages. You may want to stay away from caffeine or alcohol. If you are nauseated, try taking small sips of liquids. How do you know if you are getting enough fluid? Your urine should be a pale yellow or almost colorless. Get rest. Taking a steamy shower or using a humidifier may help nasal congestion and ease sore throat pain. Using a saline nasal spray works much the same way. Cough drops, hard candies and sore throat lozenges may ease your cough. Line up a caregiver. Have someone check on you regularly.  GET HELP RIGHT AWAY IF: You cannot keep down liquids or your medications. You become short of breath Your fell like you are going to pass out or loose consciousness. Your symptoms persist after you have completed your treatment plan  MAKE SURE YOU  Understand these instructions. Will watch your condition. Will get help right away if you are not doing well or get worse.  Your e-visit answers were reviewed by a board certified advanced clinical practitioner to complete your personal care plan.  Depending on the condition, your plan could have included both over the counter or prescription medications.  If there is a problem please reply  once you have received a response from your provider.  Your safety is important to us .  If you have drug allergies check your prescription carefully.    You can use MyChart to ask questions about todays visit, request a non-urgent call back, or ask for a work or school excuse for 24 hours related to this e-Visit. If it has been greater than 24 hours you will need to follow up with  your provider, or enter a new e-Visit to address those concerns.  You will get an e-mail in the next two days asking about your experience.  I hope that your e-visit has been valuable and will speed your recovery. Thank you for using e-visits.   I have spent 5 minutes in review of e-visit questionnaire, review and updating patient chart, medical decision making and response to patient.   Chiquita CHRISTELLA Barefoot, NP

## 2024-09-29 ENCOUNTER — Telehealth: Admitting: Family Medicine

## 2024-09-29 NOTE — Progress Notes (Signed)
 Pt did not show up for visit-DWB

## 2024-09-30 ENCOUNTER — Other Ambulatory Visit: Payer: Self-pay | Admitting: Family Medicine

## 2024-09-30 ENCOUNTER — Telehealth

## 2024-09-30 DIAGNOSIS — U071 COVID-19: Secondary | ICD-10-CM | POA: Diagnosis not present

## 2024-09-30 NOTE — Patient Instructions (Signed)
 " Carlos Jacobs, thank you for joining Roosvelt Mater, PA-C for today's virtual visit.  While this provider is not your primary care provider (PCP), if your PCP is located in our provider database this encounter information will be shared with them immediately following your visit.   A [facility] MyChart account gives you access to today's visit and all your visits, tests, and labs performed at [facility]  click here if you don't have a [facility] MyChart account or go to [email or web address]  Consent: (Patient) Carlos Jacobs provided verbal consent for this virtual visit at the beginning of the encounter.  Current Medications:  Current Outpatient Medications:    albuterol  (VENTOLIN  HFA) 108 (90 Base) MCG/ACT inhaler, Inhale 2 puffs into the lungs every 6 (six) hours as needed for wheezing or shortness of breath., Disp: 1 each, Rfl: 2   aspirin  EC 81 MG tablet, Take 1 tablet (81 mg total) by mouth daily. Swallow whole., Disp: , Rfl:    atorvastatin  (LIPITOR ) 80 MG tablet, TAKE ONE TABLET BY MOUTH ONE TIME DAILY, Disp: 90 tablet, Rfl: 3   benzonatate  (TESSALON ) 100 MG capsule, Take 1-2 capsules (100-200 mg total) by mouth 3 (three) times daily as needed for cough., Disp: 30 capsule, Rfl: 0   ketoconazole  (NIZORAL ) 2 % cream, Apply 1 Application topically 2 (two) times daily. For 2 weeks, Disp: 60 g, Rfl: 0   lisdexamfetamine  (VYVANSE ) 30 MG capsule, Take 1 capsule (30 mg total) by mouth daily., Disp: 90 capsule, Rfl: 0   Melatonin 10 MG TABS, Take 10 mg by mouth at bedtime as needed (sleep)., Disp: , Rfl:    tirzepatide  (ZEPBOUND ) 7.5 MG/0.5ML Pen, Inject 7.5 mg into the skin once a week., Disp: 2 mL, Rfl: 5   Medications ordered in this encounter:  No orders of the defined types were placed in this encounter.    *If you need refills on other medications prior to your next appointment, please contact your pharmacy*  Follow-Up: Call back or seek an in-person  evaluation if the symptoms worsen or if the condition fails to improve as anticipated.  [facility] [phone]  Other Instructions COVID-19: What to Know COVID-19 is an infection caused by a virus called SARS-CoV-2. This type of virus is called a coronavirus. People with COVID-19 may: Have few to no symptoms. Have mild to moderate symptoms that affect their lungs and breathing. Get very sick. What are the causes?  COVID-19 is caused by a virus. This virus may be in the air as droplets or on surfaces. It can spread from an infected person when they cough, sneeze, speak, sing, or breathe. You may become infected if: You breathe in the infected droplets in the air. You touch an object that has the virus on it. What increases the risk? You are at risk of getting COVID-19 if you have been around someone with the infection. You may be more likely to get very sick if: You are 44 years old or older. You have certain medical conditions, such as: Heart disease. Diabetes. Long-term respiratory disease. Cancer. Pregnancy. You are immunocompromised. This means your body can't fight infections easily. You have a disability that makes it hard for you to move around, you have trouble moving, or you can't move at all. What are the signs or symptoms? People may have different symptoms from COVID-19. The symptoms can also be mild to very bad. They often show up in 5-6 days after being infected. But, they can take  up to 14 days to appear. Common symptoms are: Cough. Feeling tired. New loss of taste or smell. Fever. Less common symptoms are: Sore throat. Headache. Body or muscle aches. Diarrhea. A skin rash or fingers or toes that are a different color than usual. Red or irritated eyes. Sometimes, COVID-19 does not cause symptoms. How is this diagnosed? COVID-19 can be diagnosed with tests done in the lab or at home. Fluid from your nose, mouth, or lungs will be used to check  for the virus. How is this treated? Treatment for COVID-19 depends on how sick you are. Mild symptoms can be treated at home with rest, fluids, and over-the-counter medicines. very bad symptoms may be treated in a hospital intensive care unit (ICU). If you have symptoms and are at risk of getting very sick, you may be given a medicine that fights viruses. This medicine is called an antiviral. How is this prevented? To protect yourself from COVID-19: Know your risk factors. Get vaccinated. If your body can't fight infections easily, talk to your provider about treatment to help prevent COVID-19. Stay at least about 3 feet (1 meter) away from other people. Wear mask that fits well when: You can't stay at a distance from people. You're in a place with not a lot of air flow. Try to be in open spaces with good air flow when you are in public. Wash your hands often or use an alcohol-based hand sanitizer. Cover your nose and mouth when you cough or sneeze. If you think you have COVID-19 or have been around someone who has it, stay home and away from other people as told by your provider or health officials. Where to find more information To learn more: Go to tonerpromos.no Click Health Topics. Type COVID-19 in the search box. Go to visitdestination.com.br Click Health Topics. Then click All Topics. Type COVID-19 in the search box. Get help right away if: You have trouble breathing or get short of breath. You have pain or pressure in your chest. You're feeling confused. These symptoms may be an emergency. Get help right away. Call 911. Do not wait to see if the symptoms will go away. Do not drive yourself to the hospital. This information is not intended to replace advice given to you by your health care provider. Make sure you discuss any questions you have with your health care provider. Document Revised: 07/01/2023 Document Reviewed: 06/23/2023 Elsevier Patient Education  [employer].   If you have been instructed to have an in-person evaluation today at a local Urgent Care facility, please use the link below. It will take you to a list of all of our available [facility], including address, phone number and hours of operation. Please do not delay care.  [facility]  If you or a family member do not have a primary care provider, use the link below to schedule a visit and establish care. When you choose a [facility] primary care physician or advanced practice provider, you gain a long-term partner in health. Find a Primary Care Provider  Learn more about [facility]'s in-office and virtual care options: [facility] - Get Care Now  "

## 2024-09-30 NOTE — Progress Notes (Signed)
 " Virtual Visit Consent   Carlos Jacobs, you are scheduled for a virtual visit with a [facility] provider today. Just as with appointments in the office, your consent must be obtained to participate. Your consent will be active for this visit and any virtual visit you may have with one of our providers in the next 365 days. If you have a MyChart account, a copy of this consent can be sent to you electronically.  As this is a virtual visit, video technology does not allow for your provider to perform a traditional examination. This may limit your provider's ability to fully assess your condition. If your provider identifies any concerns that need to be evaluated in person or the need to arrange testing (such as labs, EKG, etc.), we will make arrangements to do so. Although advances in technology are sophisticated, we cannot ensure that it will always work on either your end or our end. If the connection with a video visit is poor, the visit may have to be switched to a telephone visit. With either a video or telephone visit, we are not always able to ensure that we have a secure connection.  By engaging in this virtual visit, you consent to the provision of healthcare and authorize for your insurance to be billed (if applicable) for the services provided during this visit. Depending on your insurance coverage, you may receive a charge related to this service.  I need to obtain your verbal consent now. Are you willing to proceed with your visit today? Carlos Jacobs has provided verbal consent on 09/30/2024 for a virtual visit (video or telephone). Carlos Jacobs, [place]  Date: 09/30/2024 10:48 AM   Virtual Visit via Video Note   I, Carlos Jacobs, connected with  Carlos Jacobs  ([id number], 25-Jul-1996) on 09/30/2024 at 10:45 AM EST by a video-enabled telemedicine application and verified that I am speaking with the correct person using two identifiers.  Location: Patient: Virtual  Visit Location Patient: Home Provider: Virtual Visit Location Provider: Home Office   I discussed the limitations of evaluation and management by telemedicine and the availability of in person appointments. The patient expressed understanding and agreed to proceed.    History of Present Illness: Carlos Jacobs is a 28 y.o. who identifies as a male who was assigned male at birth, and is being seen today for c/o testing positive covid trest a few days ago. Pt states at the urgent care he was not prescribed the Paxlovid.  Pt states he wanted to start Paxlovid to decrease viral load before meeting with family for the holidays. Pt states symptoms started Tuesday.  Pt states he has cough and is coughing up phlegm.  Pt states no fever today.  Pt states taking Tylenol  every 6 hours but states doesn't need anything for cough and congestion.   HPI: HPI  Problems:  Patient Active Problem List   Diagnosis Date Noted   Animal bite of left lower leg with infection 06/16/2022   Loud snoring 06/05/2022   PFO (patent foramen ovale) 05/02/2021   Hyperglycemia 04/10/2021   Hyperlipidemia 04/10/2021   History of cardioembolic cerebrovascular accident (CVA) 04/07/2021   Former smoker 05/02/2018   Ganglion cyst of dorsum of left wrist 05/02/2018   Acne 05/01/2015   Asthma    Migraines    Depression    ADHD (attention deficit hyperactivity disorder)    Tetralogy of Fallot     Allergies: Allergies[1] Medications: Current Medications[2]  Observations/Objective: Patient is well-developed, well-nourished in no acute  distress.  Resting comfortably at home.  Head is normocephalic, atraumatic.  No labored breathing.  Speech is clear and coherent with logical content.  Patient is alert and oriented at baseline.    Assessment and Plan: There are no diagnoses linked to this encounter. -Advised Pt he is out of the window for anti-viral treatment but given his improving symptoms, he should be on the way  to recuperating from Covid. -Pt was advised to continue conservative treatments and if worsening symptoms to follow up with urgent care as needed -Pt verbalized understanding   Follow Up Instructions: I discussed the assessment and treatment plan with the patient. The patient was provided an opportunity to ask questions and all were answered. The patient agreed with the plan and demonstrated an understanding of the instructions.  A copy of instructions were sent to the patient via MyChart unless otherwise noted below.    The patient was advised to call back or seek an in-person evaluation if the symptoms worsen or if the condition fails to improve as anticipated.    Carlos Mater, PA-C    [1] No Known Allergies [2]  Current Outpatient Medications:    albuterol  (VENTOLIN  HFA) 108 (90 Base) MCG/ACT inhaler, Inhale 2 puffs into the lungs every 6 (six) hours as needed for wheezing or shortness of breath., Disp: 1 each, Rfl: 2   aspirin  EC 81 MG tablet, Take 1 tablet (81 mg total) by mouth daily. Swallow whole., Disp: , Rfl:    atorvastatin  (LIPITOR ) 80 MG tablet, TAKE ONE TABLET BY MOUTH ONE TIME DAILY, Disp: 90 tablet, Rfl: 3   benzonatate  (TESSALON ) 100 MG capsule, Take 1-2 capsules (100-200 mg total) by mouth 3 (three) times daily as needed for cough., Disp: 30 capsule, Rfl: 0   ketoconazole  (NIZORAL ) 2 % cream, Apply 1 Application topically 2 (two) times daily. For 2 weeks, Disp: 60 g, Rfl: 0   lisdexamfetamine  (VYVANSE ) 30 MG capsule, Take 1 capsule (30 mg total) by mouth daily., Disp: 90 capsule, Rfl: 0   Melatonin 10 MG TABS, Take 10 mg by mouth at bedtime as needed (sleep)., Disp: , Rfl:    tirzepatide  (ZEPBOUND ) 7.5 MG/0.5ML Pen, Inject 7.5 mg into the skin once a week., Disp: 2 mL, Rfl: 5  "

## 2024-10-03 ENCOUNTER — Encounter: Payer: Self-pay | Admitting: Family Medicine

## 2024-10-03 ENCOUNTER — Telehealth: Payer: Self-pay | Admitting: Family Medicine

## 2024-10-03 NOTE — Telephone Encounter (Signed)
 Called pt to be sche for a med refill transfer to office or get sche within 6 months.

## 2024-10-03 NOTE — Telephone Encounter (Signed)
 Please call patient to schedule 6 month follow up so we can refill medication.   Thanks!

## 2024-10-03 NOTE — Telephone Encounter (Signed)
 Lvm to cb and get sche aw

## 2024-10-06 NOTE — Telephone Encounter (Signed)
 Called lvm 10/06/24 emmitt

## 2024-10-09 NOTE — Telephone Encounter (Signed)
 Lvm again for pt to get sche.

## 2024-10-13 ENCOUNTER — Telehealth: Payer: Self-pay

## 2024-10-13 ENCOUNTER — Other Ambulatory Visit: Payer: Self-pay | Admitting: Family Medicine

## 2024-10-13 NOTE — Telephone Encounter (Signed)
 The other issue is I asked for him to get set up with 6 month visit- lets use this as opportunity to get him scheduled instead of just yearly and then I will send it again (I already sent it once today but them requesting second refill reminded me h eneeds to schedule)

## 2024-10-13 NOTE — Telephone Encounter (Signed)
 Copied from CRM [id number]. Topic: Clinical - Prescription Issue >> Oct 13, 2024  2:05 PM Victoria A wrote: Reason for CRM: [facility] called said they need lisdexamfetamine  (VYVANSE ) 30 MG capsule resent to pharmacy

## 2024-10-13 NOTE — Telephone Encounter (Signed)
"  Refill already sent  "

## 2024-10-16 NOTE — Telephone Encounter (Signed)
"  LVM to schedule OV   "

## 2024-10-18 NOTE — Telephone Encounter (Signed)
 Sche on 11/17/24, thanks!

## 2024-11-17 ENCOUNTER — Encounter: Payer: Self-pay | Admitting: Family Medicine

## 2024-11-17 ENCOUNTER — Ambulatory Visit: Admitting: Family Medicine

## 2024-11-17 VITALS — BP 116/68 | HR 78 | Temp 97.2°F | Ht 70.0 in | Wt 255.4 lb

## 2024-11-17 DIAGNOSIS — E785 Hyperlipidemia, unspecified: Secondary | ICD-10-CM

## 2024-11-17 DIAGNOSIS — F900 Attention-deficit hyperactivity disorder, predominantly inattentive type: Secondary | ICD-10-CM

## 2024-11-17 DIAGNOSIS — R7303 Prediabetes: Secondary | ICD-10-CM

## 2024-11-17 DIAGNOSIS — J4520 Mild intermittent asthma, uncomplicated: Secondary | ICD-10-CM

## 2024-11-17 MED ORDER — LISDEXAMFETAMINE DIMESYLATE 30 MG PO CAPS: 30.0000 mg | ORAL_CAPSULE | Freq: Every day | ORAL | 0 refills | Status: AC

## 2024-11-17 MED ORDER — ZEPBOUND 10 MG/0.5ML ~~LOC~~ SOAJ: 10.0000 mg | SUBCUTANEOUS | 5 refills | Status: AC

## 2024-11-17 MED ORDER — ALBUTEROL SULFATE HFA 108 (90 BASE) MCG/ACT IN AERS: 2.0000 | INHALATION_SPRAY | Freq: Four times a day (QID) | RESPIRATORY_TRACT | 2 refills | Status: AC | PRN

## 2024-11-17 NOTE — Progress Notes (Signed)
 " Phone [phone] In person visit   Subjective:   Carlos Jacobs is a 29 y.o. year old very pleasant male patient who presents for/with See problem oriented charting Chief Complaint  Patient presents with   Medication Refill    Here for a few medication refills. Would like to talk about upping the Zepbound . Happy with the Vyvanse  dose, working well.     Past Medical History-  Patient Active Problem List   Diagnosis Date Noted   PFO (patent foramen ovale) 05/02/2021    Priority: High   History of cardioembolic cerebrovascular accident (CVA) 04/07/2021    Priority: High   ADHD (attention deficit hyperactivity disorder)     Priority: High   Tetralogy of Fallot     Priority: High   Hyperglycemia 04/10/2021    Priority: Medium    Hyperlipidemia 04/10/2021    Priority: Medium    Asthma     Priority: Medium    Migraines     Priority: Medium    History of depression     Priority: Medium    Former smoker 05/02/2018    Priority: Low   Ganglion cyst of dorsum of left wrist 05/02/2018    Priority: Low   Acne 05/01/2015    Priority: Low   Animal bite of left lower leg with infection 06/16/2022   Loud snoring 06/05/2022    Medications- reviewed and updated Current Outpatient Medications  Medication Sig Dispense Refill   aspirin  EC 81 MG tablet Take 1 tablet (81 mg total) by mouth daily. Swallow whole.     atorvastatin  (LIPITOR ) 80 MG tablet TAKE ONE TABLET BY MOUTH ONE TIME DAILY 90 tablet 3   ketoconazole  (NIZORAL ) 2 % cream Apply 1 Application topically 2 (two) times daily. For 2 weeks 60 g 0   Melatonin 10 MG TABS Take 10 mg by mouth at bedtime as needed (sleep). (Patient taking differently: Take 10 mg by mouth as needed (sleep).)     tirzepatide  (ZEPBOUND ) 10 MG/0.5ML Pen Inject 10 mg into the skin once a week. 2 mL 5   albuterol  (VENTOLIN  HFA) 108 (90 Base) MCG/ACT inhaler Inhale 2 puffs into the lungs every 6 (six) hours as needed for wheezing or shortness of  breath. 1 each 2   [START ON 01/16/2025] lisdexamfetamine  (VYVANSE ) 30 MG capsule Take 1 capsule (30 mg total) by mouth daily. 90 capsule 0   No current facility-administered medications for this visit.     Objective:  BP 116/68 (BP Location: Left Arm, Patient Position: Sitting, Cuff Size: Large)   Pulse 78   Temp (!) 97.2 F (36.2 C) (Temporal)   Ht 5' 10 (1.778 m)   Wt 255 lb 6.4 oz (115.8 kg)   SpO2 95%   BMI 36.65 kg/m  Gen: NAD, resting comfortably CV: RRR no murmurs rubs or gallops Lungs: CTAB no crackles, wheeze, rhonchi  Ext: minimal edema Skin: warm, dry     Assessment and Plan   #social update- wife 7 months pregnant- 11/17/24- due in may!   # History of CVA-related to PFO now status post percutaneous closure with Dr. 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 Pietro - was told possible 10 year recheck after last visit age 30  - but had echocardiogram at 8 years with PFO issues   # ADD S:control: good control medication: Vyvanse  30 mg.  Mild appetite suppression with this and significant increased appetite off of medication Controlled substance contract: 01/02/2022 [place]CC[place]/PDMP reviewed: low risk refills through us  Original diagnosis: records from Luke Loss MD [facility], [facility] doctor in my basket confirmed- may rx vyvanse  UDS: 06/29/24  A/P: well controlled continue current medications - refill 90 days- and can call for another 90 days in 3 months    # Hyperglycemia/insulin  resistance/prediabetes-A1c as high as 5.9 S:  Medication: Zepbound  7.5 mg for obesity- feels like he has hit plateau. Did skip  Exercise and diet- exercise 3 times a week for an hour. Feels ate not as well over holidays Wt Readings from Last 3 Encounters:  11/17/24 255 lb 6.4 oz (115.8 kg)  06/29/24 252 lb 6.4 oz (114.5 kg)  03/07/24 280 lb (127 kg)   A/P: weight loss has stagnated- will trial 10 mg dose and he will update is if not making progress or plateaus again  # Exercise-induced asthma-albuterol  available if needed- no recent issues but did refill -getting further out from cigarettes has helped   #prediabetes noted- a1c 6- push for more weight loss and recheck at physical in September   Recommended follow up: Return in about 6 months (around 05/17/2025) for physical or sooner if needed.Schedule b4 you leave. Future Appointments  Date Time Provider Department Center  07/05/2025  8:20 AM Katrinka Garnette KIDD, MD [facility]   Lab/Order associations:   ICD-10-CM   1. Attention deficit hyperactivity disorder (ADHD), predominantly inattentive type  F90.0     2. Hyperlipidemia, unspecified hyperlipidemia type  E78.5     3. Mild intermittent asthma without complication  J45.20     4. Prediabetes  R73.03       Meds ordered this encounter  Medications   lisdexamfetamine  (VYVANSE ) 30 MG capsule    Sig: Take 1 capsule (30 mg total) by mouth daily.    Dispense:  90 capsule    Refill:  0   tirzepatide  (ZEPBOUND ) 10 MG/0.5ML Pen    Sig: Inject 10 mg into the skin once a week.    Dispense:  2 mL    Refill:  5   albuterol  (VENTOLIN  HFA) 108 (90 Base) MCG/ACT inhaler    Sig: Inhale 2 puffs into the lungs every 6 (six) hours as needed for wheezing or shortness of breath.    Dispense:  1 each    Refill:  2    Return precautions advised.  Garnette Katrinka, MD  "

## 2024-11-17 NOTE — Patient Instructions (Addendum)
 Glad you are doing well overall- only change is trying 10 mg of the zepbound   Recommended follow up: Return in about 6 months (around 05/17/2025) for physical or sooner if needed.Schedule b4 you leave.

## 2025-07-05 ENCOUNTER — Encounter: Admitting: Family Medicine
